# Patient Record
Sex: Male | Born: 1990 | Race: White | Hispanic: No | Marital: Single | Smoking: Former smoker
Health system: Southern US, Community
[De-identification: ages and names within clinical notes are randomized; demographics above are authoritative.]

## PROBLEM LIST (undated history)

## (undated) DIAGNOSIS — S22009A Unspecified fracture of unspecified thoracic vertebra, initial encounter for closed fracture: Secondary | ICD-10-CM

## (undated) DIAGNOSIS — Z789 Other specified health status: Secondary | ICD-10-CM

## (undated) HISTORY — PX: FRACTURE SURGERY: SHX138

---

## 2016-07-18 ENCOUNTER — Emergency Department (HOSPITAL_COMMUNITY): Payer: 59

## 2016-07-18 ENCOUNTER — Inpatient Hospital Stay (HOSPITAL_COMMUNITY)
Admission: EM | Admit: 2016-07-18 | Discharge: 2016-07-27 | DRG: 504 | Disposition: A | Discharge status: Home Health | Payer: 59 | Attending: Emergency Medicine | Admitting: Emergency Medicine

## 2016-07-18 ENCOUNTER — Observation Stay (HOSPITAL_COMMUNITY): Payer: 59

## 2016-07-18 ENCOUNTER — Encounter (HOSPITAL_COMMUNITY): Payer: Self-pay | Admitting: Emergency Medicine

## 2016-07-18 DIAGNOSIS — G8918 Other acute postprocedural pain: Secondary | ICD-10-CM

## 2016-07-18 DIAGNOSIS — S52501A Unspecified fracture of the lower end of right radius, initial encounter for closed fracture: Secondary | ICD-10-CM

## 2016-07-18 DIAGNOSIS — D62 Acute posthemorrhagic anemia: Secondary | ICD-10-CM

## 2016-07-18 DIAGNOSIS — S22000A Wedge compression fracture of unspecified thoracic vertebra, initial encounter for closed fracture: Secondary | ICD-10-CM

## 2016-07-18 DIAGNOSIS — S62101A Fracture of unspecified carpal bone, right wrist, initial encounter for closed fracture: Secondary | ICD-10-CM

## 2016-07-18 DIAGNOSIS — S92902A Unspecified fracture of left foot, initial encounter for closed fracture: Secondary | ICD-10-CM

## 2016-07-18 DIAGNOSIS — S92352A Displaced fracture of fifth metatarsal bone, left foot, initial encounter for closed fracture: Secondary | ICD-10-CM | POA: Diagnosis present

## 2016-07-18 DIAGNOSIS — Z720 Tobacco use: Secondary | ICD-10-CM

## 2016-07-18 DIAGNOSIS — S63015A Dislocation of distal radioulnar joint of left wrist, initial encounter: Secondary | ICD-10-CM

## 2016-07-18 DIAGNOSIS — M25532 Pain in left wrist: Secondary | ICD-10-CM | POA: Diagnosis not present

## 2016-07-18 DIAGNOSIS — N179 Acute kidney failure, unspecified: Secondary | ICD-10-CM | POA: Diagnosis present

## 2016-07-18 DIAGNOSIS — I10 Essential (primary) hypertension: Secondary | ICD-10-CM

## 2016-07-18 DIAGNOSIS — E871 Hypo-osmolality and hyponatremia: Secondary | ICD-10-CM | POA: Diagnosis not present

## 2016-07-18 DIAGNOSIS — Z419 Encounter for procedure for purposes other than remedying health state, unspecified: Secondary | ICD-10-CM

## 2016-07-18 DIAGNOSIS — E669 Obesity, unspecified: Secondary | ICD-10-CM | POA: Diagnosis present

## 2016-07-18 DIAGNOSIS — S22069A Unspecified fracture of T7-T8 vertebra, initial encounter for closed fracture: Secondary | ICD-10-CM | POA: Diagnosis present

## 2016-07-18 DIAGNOSIS — S52571A Other intraarticular fracture of lower end of right radius, initial encounter for closed fracture: Secondary | ICD-10-CM | POA: Diagnosis present

## 2016-07-18 DIAGNOSIS — R739 Hyperglycemia, unspecified: Secondary | ICD-10-CM

## 2016-07-18 DIAGNOSIS — S92513A Displaced fracture of proximal phalanx of unspecified lesser toe(s), initial encounter for closed fracture: Secondary | ICD-10-CM | POA: Diagnosis present

## 2016-07-18 DIAGNOSIS — S22079A Unspecified fracture of T9-T10 vertebra, initial encounter for closed fracture: Secondary | ICD-10-CM | POA: Diagnosis present

## 2016-07-18 DIAGNOSIS — M25562 Pain in left knee: Secondary | ICD-10-CM

## 2016-07-18 DIAGNOSIS — S92241A Displaced fracture of medial cuneiform of right foot, initial encounter for closed fracture: Secondary | ICD-10-CM | POA: Diagnosis not present

## 2016-07-18 DIAGNOSIS — S62102A Fracture of unspecified carpal bone, left wrist, initial encounter for closed fracture: Secondary | ICD-10-CM

## 2016-07-18 DIAGNOSIS — S52502A Unspecified fracture of the lower end of left radius, initial encounter for closed fracture: Secondary | ICD-10-CM

## 2016-07-18 DIAGNOSIS — S52572A Other intraarticular fracture of lower end of left radius, initial encounter for closed fracture: Secondary | ICD-10-CM | POA: Diagnosis present

## 2016-07-18 DIAGNOSIS — S92312A Displaced fracture of first metatarsal bone, left foot, initial encounter for closed fracture: Secondary | ICD-10-CM | POA: Diagnosis present

## 2016-07-18 DIAGNOSIS — S22059A Unspecified fracture of T5-T6 vertebra, initial encounter for closed fracture: Secondary | ICD-10-CM | POA: Diagnosis present

## 2016-07-18 DIAGNOSIS — Z6832 Body mass index (BMI) 32.0-32.9, adult: Secondary | ICD-10-CM

## 2016-07-18 DIAGNOSIS — T148XXA Other injury of unspecified body region, initial encounter: Secondary | ICD-10-CM

## 2016-07-18 DIAGNOSIS — S92322A Displaced fracture of second metatarsal bone, left foot, initial encounter for closed fracture: Secondary | ICD-10-CM | POA: Diagnosis present

## 2016-07-18 DIAGNOSIS — F17210 Nicotine dependence, cigarettes, uncomplicated: Secondary | ICD-10-CM | POA: Diagnosis present

## 2016-07-18 DIAGNOSIS — F1010 Alcohol abuse, uncomplicated: Secondary | ICD-10-CM

## 2016-07-18 DIAGNOSIS — Q7193 Unspecified reduction defect of upper limb, bilateral: Secondary | ICD-10-CM

## 2016-07-18 DIAGNOSIS — S92332A Displaced fracture of third metatarsal bone, left foot, initial encounter for closed fracture: Secondary | ICD-10-CM | POA: Diagnosis present

## 2016-07-18 DIAGNOSIS — M25561 Pain in right knee: Secondary | ICD-10-CM

## 2016-07-18 DIAGNOSIS — S92342A Displaced fracture of fourth metatarsal bone, left foot, initial encounter for closed fracture: Secondary | ICD-10-CM | POA: Diagnosis present

## 2016-07-18 DIAGNOSIS — S92901A Unspecified fracture of right foot, initial encounter for closed fracture: Secondary | ICD-10-CM

## 2016-07-18 LAB — URINALYSIS, ROUTINE W REFLEX MICROSCOPIC
BACTERIA UA: NONE SEEN
BILIRUBIN URINE: NEGATIVE
GLUCOSE, UA: NEGATIVE mg/dL
Ketones, ur: 5 mg/dL — AB
Leukocytes, UA: NEGATIVE
Nitrite: NEGATIVE
PH: 5 (ref 5.0–8.0)
Protein, ur: 30 mg/dL — AB
SQUAMOUS EPITHELIAL / LPF: NONE SEEN

## 2016-07-18 LAB — COMPREHENSIVE METABOLIC PANEL
ALBUMIN: 4.2 g/dL (ref 3.5–5.0)
ALK PHOS: 70 U/L (ref 38–126)
ALT: 61 U/L (ref 17–63)
AST: 75 U/L — AB (ref 15–41)
Anion gap: 10 (ref 5–15)
BUN: 21 mg/dL — ABNORMAL HIGH (ref 6–20)
CALCIUM: 9.1 mg/dL (ref 8.9–10.3)
CO2: 22 mmol/L (ref 22–32)
CREATININE: 1.76 mg/dL — AB (ref 0.61–1.24)
Chloride: 104 mmol/L (ref 101–111)
GFR calc Af Amer: >60 mL/min (ref >60)
GFR calc non Af Amer: 53 mL/min — ABNORMAL LOW (ref >60)
GLUCOSE: 168 mg/dL — AB (ref 65–99)
Potassium: 4.6 mmol/L (ref 3.5–5.1)
SODIUM: 136 mmol/L (ref 135–145)
Total Bilirubin: 1.6 mg/dL — ABNORMAL HIGH (ref 0.3–1.2)
Total Protein: 7.4 g/dL (ref 6.5–8.1)

## 2016-07-18 LAB — PROTIME-INR
INR: 1.08
Prothrombin Time: 14 seconds (ref 11.4–15.2)

## 2016-07-18 LAB — CBC
HCT: 48.2 % (ref 39.0–52.0)
Hemoglobin: 16.4 g/dL (ref 13.0–17.0)
MCH: 29.7 pg (ref 26.0–34.0)
MCHC: 34 g/dL (ref 30.0–36.0)
MCV: 87.2 fL (ref 78.0–100.0)
PLATELETS: 294 10*3/uL (ref 150–400)
RBC: 5.53 MIL/uL (ref 4.22–5.81)
RDW: 12.6 % (ref 11.5–15.5)
WBC: 27 10*3/uL — ABNORMAL HIGH (ref 4.0–10.5)

## 2016-07-18 LAB — I-STAT CHEM 8, ED
BUN: 29 mg/dL — ABNORMAL HIGH (ref 6–20)
CHLORIDE: 103 mmol/L (ref 101–111)
Calcium, Ion: 1.1 mmol/L — ABNORMAL LOW (ref 1.15–1.40)
Creatinine, Ser: 1.7 mg/dL — ABNORMAL HIGH (ref 0.61–1.24)
Glucose, Bld: 163 mg/dL — ABNORMAL HIGH (ref 65–99)
HEMATOCRIT: 51 % (ref 39.0–52.0)
Hemoglobin: 17.3 g/dL — ABNORMAL HIGH (ref 13.0–17.0)
POTASSIUM: 4.5 mmol/L (ref 3.5–5.1)
SODIUM: 138 mmol/L (ref 135–145)
TCO2: 25 mmol/L (ref 0–100)

## 2016-07-18 LAB — I-STAT CG4 LACTIC ACID, ED: LACTIC ACID, VENOUS: 2.85 mmol/L — AB (ref 0.5–1.9)

## 2016-07-18 LAB — SAMPLE TO BLOOD BANK

## 2016-07-18 LAB — LACTIC ACID, PLASMA: Lactic Acid, Venous: 1.6 mmol/L (ref 0.5–1.9)

## 2016-07-18 LAB — CDS SEROLOGY

## 2016-07-18 LAB — ETHANOL

## 2016-07-18 MED ORDER — FENTANYL CITRATE (PF) 100 MCG/2ML IJ SOLN
100.0000 ug | Freq: Once | INTRAMUSCULAR | Status: DC
  Filled 2016-07-18: qty 2

## 2016-07-18 MED ORDER — SODIUM CHLORIDE 0.45 % IV SOLN
INTRAVENOUS | Status: DC
  Administered 2016-07-18 – 2016-07-20 (×6): via INTRAVENOUS

## 2016-07-18 MED ORDER — HYDROMORPHONE HCL 1 MG/ML IJ SOLN
INTRAMUSCULAR | Status: AC
  Filled 2016-07-18: qty 1

## 2016-07-18 MED ORDER — ONDANSETRON HCL 4 MG/2ML IJ SOLN: 4.0000 mg | Freq: Four times a day (QID) | INTRAMUSCULAR | Status: DC | PRN

## 2016-07-18 MED ORDER — PROPOFOL 10 MG/ML IV BOLUS
INTRAVENOUS | Status: AC | PRN
  Administered 2016-07-18: 20 mg via INTRAVENOUS
  Administered 2016-07-18: 40 mg via INTRAVENOUS
  Administered 2016-07-18: 80 mg via INTRAVENOUS
  Administered 2016-07-18: 20 mg via INTRAVENOUS

## 2016-07-18 MED ORDER — SODIUM CHLORIDE 0.9 % IV BOLUS (SEPSIS)
500.0000 mL | Freq: Once | INTRAVENOUS | Status: AC
  Administered 2016-07-18: 500 mL via INTRAVENOUS

## 2016-07-18 MED ORDER — DIPHENHYDRAMINE HCL 50 MG/ML IJ SOLN: 12.5000 mg | Freq: Four times a day (QID) | INTRAMUSCULAR | Status: DC | PRN

## 2016-07-18 MED ORDER — PROPOFOL 10 MG/ML IV BOLUS
INTRAVENOUS | Status: AC
  Filled 2016-07-18: qty 20

## 2016-07-18 MED ORDER — SODIUM CHLORIDE 0.9% FLUSH
9.0000 mL | INTRAVENOUS | Status: DC | PRN
Start: 2016-07-18 — End: 2016-07-24

## 2016-07-18 MED ORDER — DIPHENHYDRAMINE HCL 12.5 MG/5ML PO ELIX: 12.5000 mg | ORAL_SOLUTION | Freq: Four times a day (QID) | ORAL | Status: DC | PRN

## 2016-07-18 MED ORDER — ONDANSETRON HCL 4 MG/2ML IJ SOLN
4.0000 mg | Freq: Four times a day (QID) | INTRAMUSCULAR | Status: DC | PRN
Start: 2016-07-18 — End: 2016-07-27
  Administered 2016-07-21: 4 mg via INTRAVENOUS
  Filled 2016-07-18: qty 2

## 2016-07-18 MED ORDER — TETANUS-DIPHTH-ACELL PERTUSSIS 5-2.5-18.5 LF-MCG/0.5 IM SUSP
0.5000 mL | Freq: Once | INTRAMUSCULAR | Status: AC
  Administered 2016-07-18: 0.5 mL via INTRAMUSCULAR
  Filled 2016-07-18: qty 0.5

## 2016-07-18 MED ORDER — MIDAZOLAM HCL 2 MG/2ML IJ SOLN
4.0000 mg | Freq: Once | INTRAMUSCULAR | Status: DC
  Filled 2016-07-18: qty 4

## 2016-07-18 MED ORDER — HYDROMORPHONE 1 MG/ML IV SOLN
INTRAVENOUS | Status: DC
  Administered 2016-07-18: 25 mg via INTRAVENOUS
  Filled 2016-07-18 (×2): qty 25

## 2016-07-18 MED ORDER — NALOXONE HCL 0.4 MG/ML IJ SOLN: 0.4000 mg | INTRAMUSCULAR | Status: DC | PRN

## 2016-07-18 MED ORDER — LIDOCAINE HCL 2 % IJ SOLN
20.0000 mL | Freq: Once | INTRAMUSCULAR | Status: AC
  Administered 2016-07-18: 400 mg
  Filled 2016-07-18: qty 20

## 2016-07-18 MED ORDER — HYDROMORPHONE HCL 1 MG/ML IJ SOLN
1.0000 mg | Freq: Once | INTRAMUSCULAR | Status: AC
  Administered 2016-07-18: 1 mg via INTRAVENOUS

## 2016-07-18 MED ORDER — IOPAMIDOL (ISOVUE-300) INJECTION 61%
INTRAVENOUS | Status: AC
  Administered 2016-07-18: 100 mL
  Filled 2016-07-18: qty 100

## 2016-07-18 MED ORDER — PROPOFOL 10 MG/ML IV BOLUS: 0.5000 mg/kg | Freq: Once | INTRAVENOUS | Status: DC

## 2016-07-18 MED ORDER — HYDROMORPHONE HCL 1 MG/ML IJ SOLN
1.0000 mg | INTRAMUSCULAR | Status: DC | PRN
Start: 2016-07-18 — End: 2016-07-24
  Administered 2016-07-18 – 2016-07-20 (×5): 1 mg via INTRAVENOUS
  Filled 2016-07-18 (×5): qty 1

## 2016-07-18 MED ORDER — ONDANSETRON HCL 4 MG PO TABS: 4.0000 mg | ORAL_TABLET | Freq: Four times a day (QID) | ORAL | Status: DC | PRN

## 2016-07-18 MED ORDER — HYDROMORPHONE HCL 1 MG/ML IJ SOLN
1.0000 mg | Freq: Once | INTRAMUSCULAR | Status: AC
  Administered 2016-07-18: 1 mg via INTRAVENOUS
  Filled 2016-07-18: qty 1

## 2016-07-18 NOTE — Progress Notes (Signed)
Orthopedic Tech Progress Note Patient Details:  Daniel BasemanBrandon Medina 1990-06-26 [id number]  Ortho Devices Type of Ortho Device: Short leg splint Ortho Device/Splint Location: Applied Short leg splint to pt right leg/ankle.  pt tolerated very well.  Family at bedside.  Ortho Device/Splint Interventions: Application   Alvina ChouWilliams, Anik Wesch C 07/18/2016, 12:38 PM

## 2016-07-18 NOTE — ED Notes (Signed)
Patient transported to CT and xray 

## 2016-07-18 NOTE — Progress Notes (Signed)
Orthopedic Tech Progress Note Patient Details:  Daniel Medina [birth year] [id number]  Ortho Devices Type of Ortho Device: Finger trap, Short arm splint, Arm sling Finger Trap Weight: 20Lbs Ortho Device/Splint Location: Bilateral Wrists  (Left and right)   Provided Dr. Philip AspenMike Jefferys ER Doctor with supplies to apply Short Arm Splints to pt left and right wrist.  (Items used: plaster, webrolls, ace bandage wraps, and Arm Slings) Ortho Device/Splint Interventions: Other (comment), Application, Adjustment   Alvina ChouWilliams, Briani Maul C 07/18/2016, 12:49 PM

## 2016-07-18 NOTE — Progress Notes (Signed)
Orthopedic Tech Progress Note Patient Details:  Daniel BasemanBrandon Swindler Jul 23, 1990 [id number]   Patient ID: Daniel Medina, male   DOB: Jul 23, 1990, 26 y.o.   MRN: [id number]  Applied Short leg splint to pt right leg/ankle. pt tolerated very well. Family at bedside.   Alvina ChouWilliams, Yisrael Obryan C 07/18/2016, 12:49 PM

## 2016-07-18 NOTE — Progress Notes (Signed)
Placed on telemetry
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
## 2016-07-18 NOTE — Progress Notes (Signed)
Subjective: Pain in bilateral wrists.  Notes numbness in left digits.  Left splint feels tight.  States a PCA has been ordered, but has not arrived.   Objective: Vital signs in last 24 hours: Temp:  [98.5 F (36.9 C)-99.5 F (37.5 C)] 99.5 F (37.5 C) (05/08 1554) Pulse Rate:  [62-115] 92 (05/08 1554) Resp:  [13-27] 18 (05/08 1554) BP: (117-153)/(59-93) 144/66 (05/08 1554) SpO2:  [95 %-100 %] 98 % (05/08 1554) Weight:  [108.9 kg (240 lb)] 108.9 kg (240 lb) (05/08 0805)  Intake/Output from previous day: No intake/output data recorded. Intake/Output this shift: Total I/O In: 2479.2 [I.V.:1479.2; IV Piggyback:1000] Out: 600 [Urine:600]   Recent Labs  07/18/16 0810 07/18/16 0821  HGB 16.4 17.3*    Recent Labs  07/18/16 0810 07/18/16 0821  WBC 27.0*  --   RBC 5.53  --   HCT 48.2 51.0  PLT 294  --     Recent Labs  07/18/16 0810 07/18/16 0821  NA 136 138  K 4.6 4.5  CL 104 103  CO2 22  --   BUN 21* 29*  CREATININE 1.76* 1.70*  GLUCOSE 168* 163*  CALCIUM 9.1  --     Recent Labs  07/18/16 0810  INR 1.08    right hand: intact sensation and capillary refill all digits.  splint well fitting.  left hand: decreased sensation all digits.  +epl/fpl/io.  splint loosened and adjusted to decrease flexion at wrist.  compartments soft.  Assessment/Plan: s/p closed reduction bilateral distal radius fractures.  Will review CT scans.  Will need operative fixation, likely with bridge plates bilaterally.  Will watch sensation in left hand.  May have been due to tight splint and swelling.   Daniel Medina R 07/18/2016, 6:29 PM

## 2016-07-18 NOTE — ED Notes (Signed)
Verbal ok per Renae FicklePaul, [facility].
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
## 2016-07-18 NOTE — ED Notes (Signed)
C-collar removed by trauma surgery.
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
## 2016-07-18 NOTE — Sedation Documentation (Signed)
Right wrist appears to be reduced by Dr Merlyn LotKuzma.  Ortho team placing cast.
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
## 2016-07-18 NOTE — Sedation Documentation (Signed)
Left wrist appears to have been reduced by Dr Merlyn LotKuzma.  Cast being placed by ortho team.
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
## 2016-07-18 NOTE — ED Provider Notes (Signed)
.  Sedation Date/Time: 07/18/2016 1:00 PM Performed by: Margarita GrizzleAY, Brittan Butterbaugh Authorized by: Margarita GrizzleAY, Karenann Mcgrory   Consent:    Consent obtained:  Written and verbal   Consent given by:  Patient   Risks discussed:  Allergic reaction, dysrhythmia, prolonged hypoxia resulting in organ damage, inadequate sedation, nausea and respiratory compromise necessitating ventilatory assistance and intubation Indications:    Procedure performed:  Dislocation reduction   Procedure necessitating sedation performed by:  Different physician   Intended level of sedation:  Moderate (conscious sedation) Pre-sedation assessment:    NPO status caution: urgency dictates proceeding with non-ideal NPO status     ASA classification: class 1 - normal, healthy patient     Neck mobility: normal     Mouth opening:  3 or more finger widths   Thyromental distance:  4 finger widths   Mallampati score:  II - soft palate, uvula, fauces visible   Pre-sedation assessments completed and reviewed: airway patency, cardiovascular function, hydration status and mental status     History of difficult intubation: no     Pre-sedation assessment completed:  07/18/2016 1:00 PM Immediate pre-procedure details:    Reassessment: Patient reassessed immediately prior to procedure     Reviewed: vital signs and relevant labs/tests     Verified: bag valve mask available, emergency equipment available, intubation equipment available, IV patency confirmed, oxygen available and suction available   Procedure details (see MAR for exact dosages):    Sedation start time:  07/18/2016 1:55 PM   Preoxygenation:  Nasal cannula   Sedation:  Propofol   Intra-procedure monitoring:  Continuous capnometry, continuous pulse oximetry, blood pressure monitoring, cardiac monitor, frequent LOC assessments and frequent vital sign checks   Intra-procedure events: none     Sedation end time:  07/18/2016 1:55 PM   Total sedation time (minutes):  35 Post-procedure details:   Post-sedation assessment completed:  07/18/2016 1:55 PM   Attendance: Constant attendance by certified staff until patient recovered     Recovery: Patient returned to pre-procedure baseline     Estimated blood loss (see I/O flowsheets): no     Post-sedation assessments completed and reviewed: airway patency, cardiovascular function, mental status, nausea/vomiting, pain level and respiratory function     Specimens recovered:  None   Patient is stable for discharge or admission: no     Patient tolerance:  Tolerated well, no immediate complications      Margarita Grizzleay, Miriam Liles, MD 07/18/16 1356
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
## 2016-07-19 DIAGNOSIS — S92902A Unspecified fracture of left foot, initial encounter for closed fracture: Secondary | ICD-10-CM

## 2016-07-19 DIAGNOSIS — S52501A Unspecified fracture of the lower end of right radius, initial encounter for closed fracture: Secondary | ICD-10-CM

## 2016-07-19 DIAGNOSIS — S92901A Unspecified fracture of right foot, initial encounter for closed fracture: Secondary | ICD-10-CM

## 2016-07-19 DIAGNOSIS — S52502A Unspecified fracture of the lower end of left radius, initial encounter for closed fracture: Secondary | ICD-10-CM

## 2016-07-19 DIAGNOSIS — S63015A Dislocation of distal radioulnar joint of left wrist, initial encounter: Secondary | ICD-10-CM

## 2016-07-19 LAB — BASIC METABOLIC PANEL
ANION GAP: 8 (ref 5–15)
BUN: 14 mg/dL (ref 6–20)
CALCIUM: 8.5 mg/dL — AB (ref 8.9–10.3)
CHLORIDE: 102 mmol/L (ref 101–111)
CO2: 23 mmol/L (ref 22–32)
Creatinine, Ser: 1.39 mg/dL — ABNORMAL HIGH (ref 0.61–1.24)
GFR calc non Af Amer: >60 mL/min (ref >60)
Glucose, Bld: 119 mg/dL — ABNORMAL HIGH (ref 65–99)
POTASSIUM: 3.9 mmol/L (ref 3.5–5.1)
Sodium: 133 mmol/L — ABNORMAL LOW (ref 135–145)

## 2016-07-19 LAB — CBC
HCT: 42.2 % (ref 39.0–52.0)
HEMOGLOBIN: 13.9 g/dL (ref 13.0–17.0)
MCH: 28.9 pg (ref 26.0–34.0)
MCHC: 32.9 g/dL (ref 30.0–36.0)
MCV: 87.7 fL (ref 78.0–100.0)
Platelets: 211 10*3/uL (ref 150–400)
RBC: 4.81 MIL/uL (ref 4.22–5.81)
RDW: 12.8 % (ref 11.5–15.5)
WBC: 13.2 10*3/uL — ABNORMAL HIGH (ref 4.0–10.5)

## 2016-07-19 LAB — HIV ANTIBODY (ROUTINE TESTING W REFLEX): HIV Screen 4th Generation wRfx: NONREACTIVE

## 2016-07-19 MED ORDER — ACETAMINOPHEN 325 MG PO TABS
650.0000 mg | ORAL_TABLET | Freq: Four times a day (QID) | ORAL | Status: DC
  Administered 2016-07-19 – 2016-07-20 (×4): 650 mg via ORAL
  Filled 2016-07-19 (×5): qty 2

## 2016-07-19 MED ORDER — HYDROMORPHONE 1 MG/ML IV SOLN
INTRAVENOUS | Status: DC
  Administered 2016-07-19: 7.75 mg via INTRAVENOUS
  Administered 2016-07-19: 2.5 mg via INTRAVENOUS
  Administered 2016-07-19 (×2): 0 via INTRAVENOUS
  Administered 2016-07-20: 4.5 mg via INTRAVENOUS
  Administered 2016-07-20: 5 mg via INTRAVENOUS
  Administered 2016-07-20: 8 mg via INTRAVENOUS
  Administered 2016-07-20: 1.5 mg via INTRAVENOUS
  Administered 2016-07-20: 4 mg via INTRAVENOUS
  Administered 2016-07-20: 2.5 mg via INTRAVENOUS
  Administered 2016-07-21: 25 mg via INTRAVENOUS
  Administered 2016-07-22: 11:00:00 via INTRAVENOUS
  Administered 2016-07-22: 5 mg via INTRAVENOUS
  Administered 2016-07-22: 8 mg via INTRAVENOUS
  Administered 2016-07-22: 7.5 mg via INTRAVENOUS
  Administered 2016-07-23: 1 mg via INTRAVENOUS
  Administered 2016-07-23 (×2): via INTRAVENOUS
  Administered 2016-07-23: 2.5 mg via INTRAVENOUS
  Administered 2016-07-24: 5.5 mg via INTRAVENOUS
  Administered 2016-07-24: 4.5 mg via INTRAVENOUS
  Administered 2016-07-24: 2 mg via INTRAVENOUS
  Filled 2016-07-19 (×9): qty 25

## 2016-07-19 MED ORDER — OXYCODONE HCL 5 MG PO TABS
5.0000 mg | ORAL_TABLET | ORAL | Status: DC | PRN
  Administered 2016-07-19: 5 mg via ORAL
  Filled 2016-07-19: qty 1

## 2016-07-19 MED ORDER — DOCUSATE SODIUM 100 MG PO CAPS
100.0000 mg | ORAL_CAPSULE | Freq: Two times a day (BID) | ORAL | Status: DC
  Administered 2016-07-22 – 2016-07-27 (×12): 100 mg via ORAL
  Filled 2016-07-19 (×14): qty 1
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
## 2016-07-20 ENCOUNTER — Encounter (HOSPITAL_COMMUNITY): Payer: Self-pay | Admitting: Anesthesiology

## 2016-07-20 DIAGNOSIS — I10 Essential (primary) hypertension: Secondary | ICD-10-CM | POA: Diagnosis not present

## 2016-07-20 DIAGNOSIS — G5602 Carpal tunnel syndrome, left upper limb: Secondary | ICD-10-CM | POA: Diagnosis not present

## 2016-07-20 DIAGNOSIS — S22069A Unspecified fracture of T7-T8 vertebra, initial encounter for closed fracture: Secondary | ICD-10-CM | POA: Diagnosis present

## 2016-07-20 DIAGNOSIS — R739 Hyperglycemia, unspecified: Secondary | ICD-10-CM | POA: Diagnosis not present

## 2016-07-20 DIAGNOSIS — S92342A Displaced fracture of fourth metatarsal bone, left foot, initial encounter for closed fracture: Secondary | ICD-10-CM | POA: Diagnosis present

## 2016-07-20 DIAGNOSIS — G8918 Other acute postprocedural pain: Secondary | ICD-10-CM | POA: Diagnosis not present

## 2016-07-20 DIAGNOSIS — S92901D Unspecified fracture of right foot, subsequent encounter for fracture with routine healing: Secondary | ICD-10-CM | POA: Diagnosis not present

## 2016-07-20 DIAGNOSIS — M25561 Pain in right knee: Secondary | ICD-10-CM | POA: Diagnosis not present

## 2016-07-20 DIAGNOSIS — T148XXA Other injury of unspecified body region, initial encounter: Secondary | ICD-10-CM | POA: Diagnosis not present

## 2016-07-20 DIAGNOSIS — S92322A Displaced fracture of second metatarsal bone, left foot, initial encounter for closed fracture: Secondary | ICD-10-CM | POA: Diagnosis present

## 2016-07-20 DIAGNOSIS — S52502K Unspecified fracture of the lower end of left radius, subsequent encounter for closed fracture with nonunion: Secondary | ICD-10-CM | POA: Diagnosis not present

## 2016-07-20 DIAGNOSIS — E669 Obesity, unspecified: Secondary | ICD-10-CM | POA: Diagnosis present

## 2016-07-20 DIAGNOSIS — S52501K Unspecified fracture of the lower end of right radius, subsequent encounter for closed fracture with nonunion: Secondary | ICD-10-CM | POA: Diagnosis not present

## 2016-07-20 DIAGNOSIS — S22059A Unspecified fracture of T5-T6 vertebra, initial encounter for closed fracture: Secondary | ICD-10-CM | POA: Diagnosis present

## 2016-07-20 DIAGNOSIS — M25532 Pain in left wrist: Secondary | ICD-10-CM | POA: Diagnosis present

## 2016-07-20 DIAGNOSIS — S92312A Displaced fracture of first metatarsal bone, left foot, initial encounter for closed fracture: Secondary | ICD-10-CM | POA: Diagnosis present

## 2016-07-20 DIAGNOSIS — S52571A Other intraarticular fracture of lower end of right radius, initial encounter for closed fracture: Secondary | ICD-10-CM | POA: Diagnosis present

## 2016-07-20 DIAGNOSIS — N179 Acute kidney failure, unspecified: Secondary | ICD-10-CM | POA: Diagnosis present

## 2016-07-20 DIAGNOSIS — D62 Acute posthemorrhagic anemia: Secondary | ICD-10-CM | POA: Diagnosis present

## 2016-07-20 DIAGNOSIS — F17210 Nicotine dependence, cigarettes, uncomplicated: Secondary | ICD-10-CM | POA: Diagnosis not present

## 2016-07-20 DIAGNOSIS — E871 Hypo-osmolality and hyponatremia: Secondary | ICD-10-CM | POA: Diagnosis not present

## 2016-07-20 DIAGNOSIS — F1010 Alcohol abuse, uncomplicated: Secondary | ICD-10-CM | POA: Diagnosis not present

## 2016-07-20 DIAGNOSIS — S92241A Displaced fracture of medial cuneiform of right foot, initial encounter for closed fracture: Secondary | ICD-10-CM | POA: Diagnosis present

## 2016-07-20 DIAGNOSIS — S92332A Displaced fracture of third metatarsal bone, left foot, initial encounter for closed fracture: Secondary | ICD-10-CM | POA: Diagnosis present

## 2016-07-20 DIAGNOSIS — Z6832 Body mass index (BMI) 32.0-32.9, adult: Secondary | ICD-10-CM | POA: Diagnosis not present

## 2016-07-20 DIAGNOSIS — S22079A Unspecified fracture of T9-T10 vertebra, initial encounter for closed fracture: Secondary | ICD-10-CM | POA: Diagnosis present

## 2016-07-20 DIAGNOSIS — Z720 Tobacco use: Secondary | ICD-10-CM | POA: Diagnosis not present

## 2016-07-20 DIAGNOSIS — S92513A Displaced fracture of proximal phalanx of unspecified lesser toe(s), initial encounter for closed fracture: Secondary | ICD-10-CM | POA: Diagnosis present

## 2016-07-20 DIAGNOSIS — S92352A Displaced fracture of fifth metatarsal bone, left foot, initial encounter for closed fracture: Secondary | ICD-10-CM | POA: Diagnosis present

## 2016-07-20 DIAGNOSIS — S52572A Other intraarticular fracture of lower end of left radius, initial encounter for closed fracture: Secondary | ICD-10-CM | POA: Diagnosis present

## 2016-07-20 LAB — BASIC METABOLIC PANEL
ANION GAP: 9 (ref 5–15)
BUN: 9 mg/dL (ref 6–20)
CHLORIDE: 99 mmol/L — AB (ref 101–111)
CO2: 24 mmol/L (ref 22–32)
Calcium: 8.2 mg/dL — ABNORMAL LOW (ref 8.9–10.3)
Creatinine, Ser: 1.21 mg/dL (ref 0.61–1.24)
GFR calc non Af Amer: >60 mL/min (ref >60)
Glucose, Bld: 107 mg/dL — ABNORMAL HIGH (ref 65–99)
POTASSIUM: 4 mmol/L (ref 3.5–5.1)
Sodium: 132 mmol/L — ABNORMAL LOW (ref 135–145)

## 2016-07-20 LAB — CK: Total CK: 3163 U/L — ABNORMAL HIGH (ref 49–397)

## 2016-07-20 MED ORDER — CEFAZOLIN SODIUM-DEXTROSE 2-4 GM/100ML-% IV SOLN
2.0000 g | INTRAVENOUS | Status: AC
  Administered 2016-07-21: 2 g via INTRAVENOUS
  Filled 2016-07-20 (×2): qty 100

## 2016-07-20 MED ORDER — OXYCODONE HCL 5 MG PO TABS
5.0000 mg | ORAL_TABLET | ORAL | Status: DC | PRN
  Administered 2016-07-20: 10 mg via ORAL
  Filled 2016-07-20 (×3): qty 2

## 2016-07-20 MED ORDER — HEPARIN SODIUM (PORCINE) 5000 UNIT/ML IJ SOLN
5000.0000 [IU] | Freq: Three times a day (TID) | INTRAMUSCULAR | Status: AC
  Administered 2016-07-20 (×2): 5000 [IU] via SUBCUTANEOUS
  Filled 2016-07-20 (×2): qty 1

## 2016-07-20 MED ORDER — SODIUM CHLORIDE 0.9 % IV SOLN
INTRAVENOUS | Status: DC
  Administered 2016-07-20: 11:00:00 via INTRAVENOUS

## 2016-07-20 MED ORDER — OXYCODONE-ACETAMINOPHEN 5-325 MG PO TABS
1.0000 | ORAL_TABLET | Freq: Four times a day (QID) | ORAL | Status: DC | PRN
  Administered 2016-07-20: 2 via ORAL
  Filled 2016-07-20: qty 2

## 2016-07-20 MED ORDER — SODIUM CHLORIDE 0.9 % IV SOLN: INTRAVENOUS | Status: DC

## 2016-07-20 MED ORDER — ACETAMINOPHEN 325 MG PO TABS
650.0000 mg | ORAL_TABLET | Freq: Four times a day (QID) | ORAL | Status: DC | PRN
Start: 2016-07-20 — End: 2016-07-21

## 2016-07-20 MED ORDER — METHOCARBAMOL 500 MG PO TABS
1000.0000 mg | ORAL_TABLET | Freq: Four times a day (QID) | ORAL | Status: DC
  Administered 2016-07-20 – 2016-07-27 (×26): 1000 mg via ORAL
  Filled 2016-07-20 (×27): qty 2
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
## 2016-07-20 NOTE — Progress Notes (Signed)
Consent signed for am surgery, placed in chart.

## 2016-07-20 NOTE — Progress Notes (Signed)
Late entry.  Seen 07/19/16 at 1300.  Subjective: Pain in bilateral wrists.  Left greater than right.  Sensation in left hand improved from yesterday.        Objective: Vital signs in last 24 hours: Temp:  [98.3 F (36.8 C)-99.4 F (37.4 C)] 98.3 F (36.8 C) (05/10 0420) Pulse Rate:  [102-118] 102 (05/10 0420) Resp:  [18-22] 20 (05/10 1300) BP: (125-152)/(67-83) 125/67 (05/10 0420) SpO2:  [92 %-100 %] 98 % (05/10 1300)  Intake/Output from previous day: 05/09 0701 - 05/10 0700 In: 2469.7 [P.O.:360; I.V.:2109.7] Out: 3635 [Urine:3635] Intake/Output this shift: Total I/O In: 525 [P.O.:525] Out: 1150 [Urine:1150]   Recent Labs (last 2 labs)    Recent Labs  07/18/16 0810 07/18/16 0821 07/19/16 0521  HGB 16.4 17.3* 13.9      Recent Labs (last 2 labs)    Recent Labs  07/18/16 0810 07/18/16 0821 07/19/16 0521  WBC 27.0*  --  13.2*  RBC 5.53  --  4.81  HCT 48.2 51.0 42.2  PLT 294  --  211      Recent Labs (last 2 labs)    Recent Labs  07/19/16 0521 07/20/16 0610  NA 133* 132*  K 3.9 4.0  CL 102 99*  CO2 23 24  BUN 14 9  CREATININE 1.39* 1.21  GLUCOSE 119* 107*  CALCIUM 8.5* 8.2*      Recent Labs (last 2 labs)    Recent Labs  07/18/16 0810  INR 1.08      improved sensation in thumb index long of left hand.  brisk capillary refill all digits.  discomfort with extension of digits.  able to flex fingers.  splint unwrapped, compartments soft.  Assessment/Plan: Doing well.  No compartment syndrome.  Will need operative fixation of bilateral distal radius fractures.  Can be done as outpatient after discharge to home or rehab.   Rodger Giangregorio R 07/20/2016, 3:20 PM

## 2016-07-20 NOTE — Anesthesia Preprocedure Evaluation (Addendum)
Anesthesia Evaluation  Patient identified by MRN, date of birth, ID band Patient awake    Reviewed: Allergy & Precautions, NPO status , Patient's Chart, lab work & pertinent test results  Airway Mallampati: II       Dental no notable dental hx.    Pulmonary Current Smoker,    Pulmonary exam normal        Cardiovascular negative cardio ROS Normal cardiovascular exam Rhythm:Regular Rate:Normal     Neuro/Psych negative neurological ROS  negative psych ROS   GI/Hepatic negative GI ROS, Neg liver ROS,   Endo/Other  negative endocrine ROS  Renal/GU negative Renal ROS  negative genitourinary   Musculoskeletal negative musculoskeletal ROS (+)   Abdominal (+) + obese,   Peds  Hematology negative hematology ROS (+)   Anesthesia Other Findings   Reproductive/Obstetrics                            Anesthesia Physical Anesthesia Plan  ASA: II  Anesthesia Plan: General   Post-op Pain Management:    Induction: Intravenous  Airway Management Planned: Oral ETT  Additional Equipment:   Intra-op Plan:   Post-operative Plan: Extubation in OR  Informed Consent: I have reviewed the patients History and Physical, chart, labs and discussed the procedure including the risks, benefits and alternatives for the proposed anesthesia with the patient or authorized representative who has indicated his/her understanding and acceptance.   Dental advisory given  Plan Discussed with: CRNA and Surgeon  Anesthesia Plan Comments:        Anesthesia Quick Evaluation
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
## 2016-07-21 ENCOUNTER — Inpatient Hospital Stay (HOSPITAL_COMMUNITY): Payer: 59

## 2016-07-21 ENCOUNTER — Encounter (HOSPITAL_COMMUNITY): Admission: EM | Disposition: A | Discharge status: Home Health | Payer: Self-pay | Source: Home / Self Care

## 2016-07-21 ENCOUNTER — Inpatient Hospital Stay (HOSPITAL_COMMUNITY): Payer: 59 | Admitting: Anesthesiology

## 2016-07-21 HISTORY — PX: ORIF ANKLE FRACTURE: SHX5408

## 2016-07-21 LAB — BASIC METABOLIC PANEL
Anion gap: 8 (ref 5–15)
BUN: 6 mg/dL (ref 6–20)
CALCIUM: 8.3 mg/dL — AB (ref 8.9–10.3)
CO2: 27 mmol/L (ref 22–32)
CREATININE: 1.11 mg/dL (ref 0.61–1.24)
Chloride: 99 mmol/L — ABNORMAL LOW (ref 101–111)
GFR calc non Af Amer: >60 mL/min (ref >60)
Glucose, Bld: 114 mg/dL — ABNORMAL HIGH (ref 65–99)
Potassium: 3.7 mmol/L (ref 3.5–5.1)
SODIUM: 134 mmol/L — AB (ref 135–145)

## 2016-07-21 LAB — CBC
HCT: 36.3 % — ABNORMAL LOW (ref 39.0–52.0)
Hemoglobin: 12 g/dL — ABNORMAL LOW (ref 13.0–17.0)
MCH: 28.7 pg (ref 26.0–34.0)
MCHC: 33.1 g/dL (ref 30.0–36.0)
MCV: 86.8 fL (ref 78.0–100.0)
PLATELETS: 153 10*3/uL (ref 150–400)
RBC: 4.18 MIL/uL — AB (ref 4.22–5.81)
RDW: 12.4 % (ref 11.5–15.5)
WBC: 8.2 10*3/uL (ref 4.0–10.5)

## 2016-07-21 LAB — CK: Total CK: 3652 U/L — ABNORMAL HIGH (ref 49–397)

## 2016-07-21 SURGERY — OPEN REDUCTION INTERNAL FIXATION (ORIF) ANKLE FRACTURE
Anesthesia: General | Laterality: Bilateral

## 2016-07-21 MED ORDER — LIDOCAINE HCL 4 % EX SOLN
CUTANEOUS | Status: DC | PRN
  Administered 2016-07-21: 2 mL via TOPICAL

## 2016-07-21 MED ORDER — MIDAZOLAM HCL 5 MG/5ML IJ SOLN
INTRAMUSCULAR | Status: DC | PRN
  Administered 2016-07-21: 2 mg via INTRAVENOUS

## 2016-07-21 MED ORDER — ACETAMINOPHEN 325 MG PO TABS
650.0000 mg | ORAL_TABLET | Freq: Four times a day (QID) | ORAL | Status: DC
  Administered 2016-07-21 – 2016-07-27 (×22): 650 mg via ORAL
  Filled 2016-07-21 (×23): qty 2

## 2016-07-21 MED ORDER — HYDROMORPHONE HCL 1 MG/ML IJ SOLN
INTRAMUSCULAR | Status: AC
Start: 2016-07-21 — End: 2016-07-21
  Filled 2016-07-21: qty 0.5

## 2016-07-21 MED ORDER — ROCURONIUM BROMIDE 10 MG/ML (PF) SYRINGE
PREFILLED_SYRINGE | INTRAVENOUS | Status: AC
  Filled 2016-07-21: qty 5

## 2016-07-21 MED ORDER — ONDANSETRON HCL 4 MG/2ML IJ SOLN
INTRAMUSCULAR | Status: AC
  Filled 2016-07-21: qty 2

## 2016-07-21 MED ORDER — LIDOCAINE 2% (20 MG/ML) 5 ML SYRINGE
INTRAMUSCULAR | Status: AC
  Filled 2016-07-21: qty 10

## 2016-07-21 MED ORDER — LIDOCAINE HCL (CARDIAC) 20 MG/ML IV SOLN
INTRAVENOUS | Status: DC | PRN
  Administered 2016-07-21: 40 mg via INTRAVENOUS

## 2016-07-21 MED ORDER — ARTIFICIAL TEARS OPHTHALMIC OINT: TOPICAL_OINTMENT | OPHTHALMIC | Status: DC | PRN

## 2016-07-21 MED ORDER — ARTIFICIAL TEARS OPHTHALMIC OINT
TOPICAL_OINTMENT | OPHTHALMIC | Status: AC
  Filled 2016-07-21: qty 3.5

## 2016-07-21 MED ORDER — PROMETHAZINE HCL 25 MG/ML IJ SOLN: 6.2500 mg | INTRAMUSCULAR | Status: DC | PRN

## 2016-07-21 MED ORDER — HYDROMORPHONE HCL 1 MG/ML IJ SOLN
0.2500 mg | INTRAMUSCULAR | Status: DC | PRN
  Administered 2016-07-21: 0.5 mg via INTRAVENOUS

## 2016-07-21 MED ORDER — SUCCINYLCHOLINE CHLORIDE 200 MG/10ML IV SOSY
PREFILLED_SYRINGE | INTRAVENOUS | Status: AC
  Filled 2016-07-21: qty 10

## 2016-07-21 MED ORDER — SODIUM CHLORIDE 0.9 % IJ SOLN
INTRAMUSCULAR | Status: AC
  Filled 2016-07-21: qty 10

## 2016-07-21 MED ORDER — SUFENTANIL CITRATE 50 MCG/ML IV SOLN
INTRAVENOUS | Status: DC | PRN
  Administered 2016-07-21 (×3): 5 ug via INTRAVENOUS
  Administered 2016-07-21: 10 ug via INTRAVENOUS
  Administered 2016-07-21 (×2): 5 ug via INTRAVENOUS

## 2016-07-21 MED ORDER — MEPERIDINE HCL 25 MG/ML IJ SOLN: 6.2500 mg | INTRAMUSCULAR | Status: DC | PRN

## 2016-07-21 MED ORDER — SUGAMMADEX SODIUM 200 MG/2ML IV SOLN
INTRAVENOUS | Status: DC | PRN
  Administered 2016-07-21: 150 mg via INTRAVENOUS

## 2016-07-21 MED ORDER — CEFAZOLIN SODIUM-DEXTROSE 2-4 GM/100ML-% IV SOLN
2.0000 g | Freq: Three times a day (TID) | INTRAVENOUS | Status: AC
  Administered 2016-07-21 – 2016-07-22 (×3): 2 g via INTRAVENOUS
  Filled 2016-07-21 (×3): qty 100

## 2016-07-21 MED ORDER — SUCCINYLCHOLINE CHLORIDE 20 MG/ML IJ SOLN
INTRAMUSCULAR | Status: DC | PRN
  Administered 2016-07-21: 120 mg via INTRAVENOUS

## 2016-07-21 MED ORDER — 0.9 % SODIUM CHLORIDE (POUR BTL) OPTIME
TOPICAL | Status: DC | PRN
  Administered 2016-07-21: 1000 mL

## 2016-07-21 MED ORDER — LACTATED RINGERS IV SOLN
INTRAVENOUS | Status: DC | PRN
  Administered 2016-07-21 (×2): via INTRAVENOUS

## 2016-07-21 MED ORDER — ROCURONIUM BROMIDE 100 MG/10ML IV SOLN
INTRAVENOUS | Status: DC | PRN
  Administered 2016-07-21: 20 mg via INTRAVENOUS
  Administered 2016-07-21: 40 mg via INTRAVENOUS

## 2016-07-21 MED ORDER — SUGAMMADEX SODIUM 200 MG/2ML IV SOLN
INTRAVENOUS | Status: AC
  Filled 2016-07-21: qty 2

## 2016-07-21 MED ORDER — ACETAMINOPHEN 10 MG/ML IV SOLN
INTRAVENOUS | Status: AC
  Filled 2016-07-21: qty 100

## 2016-07-21 MED ORDER — KETOROLAC TROMETHAMINE 30 MG/ML IJ SOLN
INTRAMUSCULAR | Status: AC
  Filled 2016-07-21: qty 1

## 2016-07-21 MED ORDER — SUFENTANIL CITRATE 50 MCG/ML IV SOLN
INTRAVENOUS | Status: AC
  Filled 2016-07-21: qty 1

## 2016-07-21 MED ORDER — MIDAZOLAM HCL 2 MG/2ML IJ SOLN
INTRAMUSCULAR | Status: AC
  Filled 2016-07-21: qty 2

## 2016-07-21 MED ORDER — ACETAMINOPHEN 10 MG/ML IV SOLN
INTRAVENOUS | Status: DC | PRN
  Administered 2016-07-21: 1000 mg via INTRAVENOUS

## 2016-07-21 MED ORDER — PROPOFOL 10 MG/ML IV BOLUS
INTRAVENOUS | Status: AC
  Filled 2016-07-21: qty 20

## 2016-07-21 MED ORDER — KETAMINE HCL 10 MG/ML IJ SOLN
INTRAMUSCULAR | Status: DC | PRN
  Administered 2016-07-21 (×2): 25 mg via INTRAVENOUS

## 2016-07-21 MED ORDER — KETAMINE HCL-SODIUM CHLORIDE 100-0.9 MG/10ML-% IV SOSY
PREFILLED_SYRINGE | INTRAVENOUS | Status: AC
  Filled 2016-07-21: qty 10

## 2016-07-21 MED ORDER — PHENYLEPHRINE 40 MCG/ML (10ML) SYRINGE FOR IV PUSH (FOR BLOOD PRESSURE SUPPORT)
PREFILLED_SYRINGE | INTRAVENOUS | Status: AC
Start: 2016-07-21 — End: 2016-07-21
  Filled 2016-07-21: qty 10

## 2016-07-21 MED ORDER — EPHEDRINE 5 MG/ML INJ
INTRAVENOUS | Status: AC
  Filled 2016-07-21: qty 10

## 2016-07-21 MED ORDER — SODIUM CHLORIDE 0.9 % IV SOLN
INTRAVENOUS | Status: DC
  Administered 2016-07-21 – 2016-07-27 (×5): via INTRAVENOUS

## 2016-07-21 MED ORDER — PROPOFOL 10 MG/ML IV BOLUS
INTRAVENOUS | Status: DC | PRN
  Administered 2016-07-21: 150 mg via INTRAVENOUS

## 2016-07-21 MED ORDER — KETOROLAC TROMETHAMINE 30 MG/ML IJ SOLN
30.0000 mg | Freq: Once | INTRAMUSCULAR | Status: DC | PRN
  Administered 2016-07-21: 30 mg via INTRAVENOUS

## 2016-07-21 SURGICAL SUPPLY — 66 items
BANDAGE ACE 4X5 VEL STRL LF (GAUZE/BANDAGES/DRESSINGS) ×6 IMPLANT
BANDAGE ACE 6X5 VEL STRL LF (GAUZE/BANDAGES/DRESSINGS) ×6 IMPLANT
BANDAGE ESMARK 6X9 LF (GAUZE/BANDAGES/DRESSINGS) IMPLANT
BNDG ESMARK 6X9 LF (GAUZE/BANDAGES/DRESSINGS)
BNDG GAUZE ELAST 4 BULKY (GAUZE/BANDAGES/DRESSINGS) ×6 IMPLANT
BRUSH SCRUB SURG 4.25 DISP (MISCELLANEOUS) ×6 IMPLANT
COVER MAYO STAND STRL (DRAPES) ×6 IMPLANT
COVER SURGICAL LIGHT HANDLE (MISCELLANEOUS) ×3 IMPLANT
DRAPE C-ARM 42X72 X-RAY (DRAPES) ×3 IMPLANT
DRAPE C-ARMOR (DRAPES) ×3 IMPLANT
DRAPE HALF SHEET 40X57 (DRAPES) ×3 IMPLANT
DRAPE U-SHAPE 47X51 STRL (DRAPES) ×3 IMPLANT
DRSG EMULSION OIL 3X3 NADH (GAUZE/BANDAGES/DRESSINGS) IMPLANT
DRSG MEPITEL 4X7.2 (GAUZE/BANDAGES/DRESSINGS) ×6 IMPLANT
DRSG PAD ABDOMINAL 8X10 ST (GAUZE/BANDAGES/DRESSINGS) ×6 IMPLANT
ELECT REM PT RETURN 9FT ADLT (ELECTROSURGICAL) ×3
ELECTRODE REM PT RTRN 9FT ADLT (ELECTROSURGICAL) ×1 IMPLANT
GAUZE SPONGE 4X4 12PLY STRL (GAUZE/BANDAGES/DRESSINGS) IMPLANT
GAUZE SPONGE 4X4 16PLY XRAY LF (GAUZE/BANDAGES/DRESSINGS) ×6 IMPLANT
GLOVE BIO SURGEON STRL SZ7.5 (GLOVE) ×3 IMPLANT
GLOVE BIO SURGEON STRL SZ8 (GLOVE) ×3 IMPLANT
GLOVE BIOGEL PI IND STRL 7.5 (GLOVE) ×1 IMPLANT
GLOVE BIOGEL PI IND STRL 8 (GLOVE) ×1 IMPLANT
GLOVE BIOGEL PI INDICATOR 7.5 (GLOVE) ×2
GLOVE BIOGEL PI INDICATOR 8 (GLOVE) ×2
GOWN STRL REUS W/ TWL LRG LVL3 (GOWN DISPOSABLE) ×2 IMPLANT
GOWN STRL REUS W/ TWL XL LVL3 (GOWN DISPOSABLE) ×1 IMPLANT
GOWN STRL REUS W/TWL LRG LVL3 (GOWN DISPOSABLE) ×4
GOWN STRL REUS W/TWL XL LVL3 (GOWN DISPOSABLE) ×2
KIT BASIN OR (CUSTOM PROCEDURE TRAY) ×3 IMPLANT
KIT ROOM TURNOVER OR (KITS) ×3 IMPLANT
MANIFOLD NEPTUNE II (INSTRUMENTS) ×3 IMPLANT
NEEDLE HYPO 21X1.5 SAFETY (NEEDLE) IMPLANT
NS IRRIG 1000ML POUR BTL (IV SOLUTION) ×3 IMPLANT
PACK GENERAL/GYN (CUSTOM PROCEDURE TRAY) IMPLANT
PACK ORTHO EXTREMITY (CUSTOM PROCEDURE TRAY) ×3 IMPLANT
PAD ABD 8X10 STRL (GAUZE/BANDAGES/DRESSINGS) ×6 IMPLANT
PAD ARMBOARD 7.5X6 YLW CONV (MISCELLANEOUS) ×6 IMPLANT
PAD CAST 4YDX4 CTTN HI CHSV (CAST SUPPLIES) ×1 IMPLANT
PADDING CAST ABS 4INX4YD NS (CAST SUPPLIES) ×2
PADDING CAST ABS 6INX4YD NS (CAST SUPPLIES) ×2
PADDING CAST ABS COTTON 4X4 ST (CAST SUPPLIES) ×1 IMPLANT
PADDING CAST ABS COTTON 6X4 NS (CAST SUPPLIES) ×1 IMPLANT
PADDING CAST COTTON 4X4 STRL (CAST SUPPLIES) ×2
PADDING CAST COTTON 6X4 STRL (CAST SUPPLIES) ×3 IMPLANT
PIN CAPS ORTHO GREEN .062 (PIN) ×6 IMPLANT
PLATE LCP 10H 84MM 2.4MM (Plate) ×3 IMPLANT
SCREW CORTEX 2.4X18 (Screw) ×3 IMPLANT
SCREW CORTEX SLFTPNG 26MM 2.4 (Screw) ×3 IMPLANT
SCREW CORTEX SLFTPNG 34MM 2.4 (Screw) ×3 IMPLANT
SCREW LOCKING SLFTPNG 30MM 2.4 (Screw) ×3 IMPLANT
SPONGE LAP 18X18 X RAY DECT (DISPOSABLE) ×3 IMPLANT
STAPLER VISISTAT 35W (STAPLE) IMPLANT
SUCTION FRAZIER HANDLE 10FR (MISCELLANEOUS) ×2
SUCTION TUBE FRAZIER 10FR DISP (MISCELLANEOUS) ×1 IMPLANT
SUT ETHILON 3 0 PS 1 (SUTURE) ×6 IMPLANT
SUT PDS AB 2-0 CT1 27 (SUTURE) IMPLANT
SUT VIC AB 2-0 CT1 27 (SUTURE) ×2
SUT VIC AB 2-0 CT1 TAPERPNT 27 (SUTURE) ×1 IMPLANT
SUT VIC AB 3-0 X1 27 (SUTURE) ×3 IMPLANT
TOWEL OR 17X24 6PK STRL BLUE (TOWEL DISPOSABLE) ×3 IMPLANT
TOWEL OR 17X26 10 PK STRL BLUE (TOWEL DISPOSABLE) ×6 IMPLANT
TUBE CONNECTING 12'X1/4 (SUCTIONS) ×1
TUBE CONNECTING 12X1/4 (SUCTIONS) ×2 IMPLANT
UNDERPAD 30X30 (UNDERPADS AND DIAPERS) ×3 IMPLANT
WATER STERILE IRR 1000ML POUR (IV SOLUTION) IMPLANT

## 2016-07-21 NOTE — Anesthesia Postprocedure Evaluation (Addendum)
Anesthesia Post Note  Patient: Daniel Medina  Procedure(s) Performed: Procedure(s) (LRB): OPEN REDUCTION INTERNAL FIXATION (ORIF) BILATERAL FEET  FRACTURE (Bilateral)  Patient location during evaluation: PACU Anesthesia Type: General Level of consciousness: awake Pain management: pain level controlled Vital Signs Assessment: post-procedure vital signs reviewed and stable Respiratory status: spontaneous breathing Cardiovascular status: stable Postop Assessment: no signs of nausea or vomiting Anesthetic complications: no        Last Vitals:  Vitals:   07/21/16 1242 07/21/16 1335  BP:    Pulse:    Resp: 17 17  Temp:      Last Pain:  Vitals:   07/21/16 1335  TempSrc:   PainSc: 7    Pain Goal:                 Darrie Macmillan JR,JOHN Parthenia Tellefsen

## 2016-07-21 NOTE — Progress Notes (Deleted)
Report called to Ebony at [facility] ([place]).  All questions answered.  
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
## 2016-07-21 NOTE — Transfer of Care (Signed)
Immediate Anesthesia Transfer of Care Note  Patient: Dorothey BasemanBrandon Sparger  Procedure(s) Performed: Procedure(s): OPEN REDUCTION INTERNAL FIXATION (ORIF) BILATERAL FEET  FRACTURE (Bilateral)  Patient Location: PACU  Anesthesia Type:General  Level of Consciousness: awake, oriented, sedated and patient cooperative  Airway & Oxygen Therapy: Patient Spontanous Breathing and Patient connected to nasal cannula oxygen  Post-op Assessment: Report given to RN, Post -op Vital signs reviewed and stable, Patient moving all extremities and Patient moving all extremities X 4  Post vital signs: Reviewed and stable  Last Vitals:  Vitals:   07/21/16 0611 07/21/16 1058  BP: 131/63 (!) 156/91  Pulse: 92 (!) 121  Resp: 17 15  Temp: 37.4 C     Last Pain:  Vitals:   07/21/16 0611  TempSrc: Oral  PainSc:          Complications: No apparent anesthesia complications

## 2016-07-21 NOTE — Progress Notes (Signed)
When patient returned from surgery this RN had asked the patient if he was able to press the PCA button.  This RN observed the patient pressing the button. Later this RN observed patient's girlfriend pressing the PCA button.  This RN explained that only the patient can press the button for pain medication otherwise we will have to change from the patient administering his own pain medication to the RN administering.  Patient's girlfriend apologized and said she was not aware but this was the only time she had done it.
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
## 2016-07-21 NOTE — Progress Notes (Signed)
Notified by bedside nurse that family requested Case Manager.  Called in room; spoke with Daniel Medina, patient's stepmother.  She has concerns about rehab versus home; need for electric wheelchair, etc.  PT/OT has not been able to work with pt yet.  Explained that we need PT/OT input prior to knowing what Daniel Medina is going to need.  Also let her know that pt may not be a candidate for inpatient rehab, as pt is NWB in all 4 extremities. Assured stepmother that we will know more once therapy sees pt, and if dc home recommended, we will work with insurance company to obtain all needed equipment.  Gave stepmother my direct number to call if she has questions.  Will follow as pt progresses.    Quintella BatonJulie W. Lillan Mccreadie, RN, BSN  Trauma/Neuro ICU Case Manager [phone]

## 2016-07-21 NOTE — Anesthesia Procedure Notes (Signed)
Procedure Name: Intubation Date/Time: 07/21/2016 8:25 AM Performed by: Izora Gala Pre-anesthesia Checklist: Patient identified, Emergency Drugs available and Patient being monitored Patient Re-evaluated:Patient Re-evaluated prior to inductionOxygen Delivery Method: Circle system utilized Preoxygenation: Pre-oxygenation with 100% oxygen Intubation Type: IV induction Ventilation: Mask ventilation without difficulty Laryngoscope Size: Miller and 3 Grade View: Grade II Tube type: Oral Tube size: 7.5 mm Number of attempts: 1 Airway Equipment and Method: Stylet and LTA kit utilized Placement Confirmation: ETT inserted through vocal cords under direct vision,  positive ETCO2 and breath sounds checked- equal and bilateral Secured at: 22 cm Tube secured with: Tape Dental Injury: Teeth and Oropharynx as per pre-operative assessment

## 2016-07-21 NOTE — Progress Notes (Signed)
PT Cancellation Note  Patient Details Name: Daniel Medina MRN: [id number] DOB: 1990/05/15   Cancelled Treatment:    Reason Eval/Treat Not Completed: Other (comment) (operative day, will plan to see next date)   [name] 07/21/2016, 1:48 PM  [name], PT [phone]

## 2016-07-22 LAB — CBC
HCT: 32.7 % — ABNORMAL LOW (ref 39.0–52.0)
Hemoglobin: 10.9 g/dL — ABNORMAL LOW (ref 13.0–17.0)
MCH: 29.2 pg (ref 26.0–34.0)
MCHC: 33.3 g/dL (ref 30.0–36.0)
MCV: 87.7 fL (ref 78.0–100.0)
PLATELETS: 205 10*3/uL (ref 150–400)
RBC: 3.73 MIL/uL — ABNORMAL LOW (ref 4.22–5.81)
RDW: 12.5 % (ref 11.5–15.5)
WBC: 8.6 10*3/uL (ref 4.0–10.5)

## 2016-07-22 LAB — BASIC METABOLIC PANEL
Anion gap: 7 (ref 5–15)
BUN: 5 mg/dL — ABNORMAL LOW (ref 6–20)
CALCIUM: 8.2 mg/dL — AB (ref 8.9–10.3)
CO2: 28 mmol/L (ref 22–32)
CREATININE: 0.99 mg/dL (ref 0.61–1.24)
Chloride: 102 mmol/L (ref 101–111)
GFR calc non Af Amer: >60 mL/min (ref >60)
GLUCOSE: 120 mg/dL — AB (ref 65–99)
Potassium: 3.8 mmol/L (ref 3.5–5.1)
Sodium: 137 mmol/L (ref 135–145)

## 2016-07-22 LAB — CK: CK TOTAL: 2679 U/L — AB (ref 49–397)

## 2016-07-22 MED ORDER — ENOXAPARIN SODIUM 40 MG/0.4ML ~~LOC~~ SOLN
40.0000 mg | SUBCUTANEOUS | Status: DC
  Administered 2016-07-23 – 2016-07-26 (×4): 40 mg via SUBCUTANEOUS
  Filled 2016-07-22 (×4): qty 0.4
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
## 2016-07-22 NOTE — Progress Notes (Signed)
Subjective: 1 Day Post-Op Procedure(s) (LRB): OPEN REDUCTION INTERNAL FIXATION (ORIF) BILATERAL FEET  FRACTURE (Bilateral) Patient reports pain as moderate.  Patient resting comfortably.  Increased pain at night, but this is tolerable.    Objective: Vital signs in last 24 hours: Temp:  [98.4 F (36.9 C)-98.7 F (37.1 C)] 98.7 F (37.1 C) (05/12 0605) Pulse Rate:  [76-121] 76 (05/12 0605) Resp:  [15-23] 22 (05/12 0400) BP: (119-156)/(58-91) 132/66 (05/12 0605) SpO2:  [95 %-100 %] 99 % (05/12 0605) FiO2 (%):  [51 %] 51 % (05/11 1242)  Intake/Output from previous day: 05/11 0701 - 05/12 0700 In: 1725 [I.V.:1625; IV Piggyback:100] Out: 1830 [Urine:1825; Blood:5] Intake/Output this shift: No intake/output data recorded.   Recent Labs  07/21/16 0519  HGB 12.0*    Recent Labs  07/21/16 0519  WBC 8.2  RBC 4.18*  HCT 36.3*  PLT 153    Recent Labs  07/20/16 0610 07/21/16 0519  NA 132* 134*  K 4.0 3.7  CL 99* 99*  CO2 24 27  BUN 9 6  CREATININE 1.21 1.11  GLUCOSE 107* 114*  CALCIUM 8.2* 8.3*   No results for input(s): LABPT, INR in the last 72 hours.  Neurologically intact Neurovascular intact Sensation intact distally  BLE and BUE splints in place No drainage to BLE Able to wiggle fingers and toes.  Minimal swelling  Assessment/Plan: 1 Day Post-Op Procedure(s) (LRB): OPEN REDUCTION INTERNAL FIXATION (ORIF) BILATERAL FEET  FRACTURE (Bilateral) PLAN NWB BLE Ice and elevate for swelling Continue SCDs and chemical ppx Continue plan per Dr. Merlyn LotKuzma for BUE Continue plan per trauma  Daniel Medina 07/22/2016, 9:56 AM

## 2016-07-22 NOTE — Progress Notes (Signed)
PT Cancellation Note  Patient Details Name: Daniel Medina MRN: [id number] DOB: 10-Aug-1990   Cancelled Treatment:    Reason Eval/Treat Not Completed: Other (comment) (working to clarify weightbearing status on UE ) Physician notes indicate NWB on bilateral UE secondary to bilateral radial fxs. Working to clarify if pt could bear weight through bilateral elbows to facilitate increased mobility.  Janielle Mittelstadt B. Beverely RisenVan Fleet PT, DPT Acute Rehabilitation  [phone] Pager [phone]   [name] 07/22/2016, 12:37 PM

## 2016-07-22 NOTE — Progress Notes (Signed)
PT and OT have evaluated pt. And are recommending IP Rehab.  Patient was screened by Weldon PickingSusan Larita Deremer for appropriateness for an Inpatient Acute Rehab consult.  At this time, we are recommending Inpatient Rehab consult.  Please order consult if you are agreeable.  Weldon PickingSusan Lamonta Cypress PT Inpatient Rehab Admissions Coordinator Cell [phone] Office [phone]
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
## 2016-07-22 NOTE — Progress Notes (Signed)
OT Cancellation   07/22/16 1200  OT Visit Information  Last OT Received On 07/22/16  Reason Eval/Treat Not Completed Other (comment) (Waiting for clarification of UE WB status through elbows to assess optimal mobilization and performance of ADLs.)   Curlene DolphinCharis Padme Arriaga, OTR/L Pager: [phone]
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
## 2016-07-23 LAB — BASIC METABOLIC PANEL
ANION GAP: 9 (ref 5–15)
BUN: 8 mg/dL (ref 6–20)
CALCIUM: 8 mg/dL — AB (ref 8.9–10.3)
CHLORIDE: 100 mmol/L — AB (ref 101–111)
CO2: 26 mmol/L (ref 22–32)
Creatinine, Ser: 1.04 mg/dL (ref 0.61–1.24)
GFR calc non Af Amer: >60 mL/min (ref >60)
Glucose, Bld: 111 mg/dL — ABNORMAL HIGH (ref 65–99)
Potassium: 4.4 mmol/L (ref 3.5–5.1)
Sodium: 135 mmol/L (ref 135–145)

## 2016-07-23 LAB — CBC
HCT: 30.8 % — ABNORMAL LOW (ref 39.0–52.0)
HEMOGLOBIN: 10.3 g/dL — AB (ref 13.0–17.0)
MCH: 29.6 pg (ref 26.0–34.0)
MCHC: 33.4 g/dL (ref 30.0–36.0)
MCV: 88.5 fL (ref 78.0–100.0)
Platelets: 225 10*3/uL (ref 150–400)
RBC: 3.48 MIL/uL — ABNORMAL LOW (ref 4.22–5.81)
RDW: 12.9 % (ref 11.5–15.5)
WBC: 7.8 10*3/uL (ref 4.0–10.5)

## 2016-07-23 MED ORDER — SENNOSIDES-DOCUSATE SODIUM 8.6-50 MG PO TABS
2.0000 | ORAL_TABLET | Freq: Two times a day (BID) | ORAL | Status: DC
  Administered 2016-07-23 – 2016-07-27 (×9): 2 via ORAL
  Filled 2016-07-23 (×9): qty 2

## 2016-07-23 MED ORDER — POLYETHYLENE GLYCOL 3350 17 G PO PACK
17.0000 g | PACK | Freq: Every day | ORAL | Status: AC
  Administered 2016-07-23 – 2016-07-24 (×2): 17 g via ORAL
  Filled 2016-07-23 (×2): qty 1
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
## 2016-07-23 NOTE — Progress Notes (Signed)
Subjective: 2 Days Post-Op Procedure(s) (LRB): OPEN REDUCTION INTERNAL FIXATION (ORIF) BILATERAL FEET  FRACTURE (Bilateral) Patient reports pain as mild.  Patient resting comfortably.    Objective: Vital signs in last 24 hours: Temp:  [98.3 F (36.8 C)-99.9 F (37.7 C)] 98.3 F (36.8 C) (05/13 0601) Pulse Rate:  [68-85] 68 (05/13 0601) Resp:  [16-23] 16 (05/13 0601) BP: (131-147)/(67-75) 131/73 (05/13 0601) SpO2:  [92 %-100 %] 100 % (05/13 0601)  Intake/Output from previous day: 05/12 0701 - 05/13 0700 In: 480 [P.O.:480] Out: 3250 [Urine:3250] Intake/Output this shift: No intake/output data recorded.   Recent Labs  07/21/16 0519 07/22/16 1120 07/23/16 0538  HGB 12.0* 10.9* 10.3*    Recent Labs  07/22/16 1120 07/23/16 0538  WBC 8.6 7.8  RBC 3.73* 3.48*  HCT 32.7* 30.8*  PLT 205 225    Recent Labs  07/22/16 1120 07/23/16 0538  NA 137 135  K 3.8 4.4  CL 102 100*  CO2 28 26  BUN 5* 8  CREATININE 0.99 1.04  GLUCOSE 120* 111*  CALCIUM 8.2* 8.0*   No results for input(s): LABPT, INR in the last 72 hours.  Neurologically intact Neurovascular intact Sensation intact distally  Splints in place to BLE/BUE Able to wiggle toes.  Minimal swelling  Assessment/Plan: 2 Days Post-Op Procedure(s) (LRB): OPEN REDUCTION INTERNAL FIXATION (ORIF) BILATERAL FEET  FRACTURE (Bilateral) Advance diet Up with therapy  NWB BLE Ice and elevate for swelling Continue scd's and chemical ppx for dvt prevention Continue plan per Dr. Merlyn LotKuzma for BUE Continue plan per trauma  Otilio SaberM Lindsey Stanbery 07/23/2016, 10:45 AM

## 2016-07-23 NOTE — Progress Notes (Signed)
2 Days Post-Op  Subjective: Stable and alert. Pain control adequate Voiding No bowel movement yet  Hemoglobin stable, so started on Lovenox for DVT prophylaxis SPO2 100%  Objective: Vital signs in last 24 hours: Temp:  [98.3 F (36.8 C)-99.9 F (37.7 C)] 98.3 F (36.8 C) (05/13 0601) Pulse Rate:  [68-85] 68 (05/13 0601) Resp:  [16-23] 16 (05/13 0601) BP: (131-147)/(67-75) 131/73 (05/13 0601) SpO2:  [92 %-100 %] 100 % (05/13 0601) Last BM Date: 07/18/16  Intake/Output from previous day: 05/12 0701 - 05/13 0700 In: 480 [P.O.:480] Out: 3250 [Urine:3250] Intake/Output this shift: No intake/output data recorded.  General appearance: Alert.  Supine in bed.  Family member with him.  Very talkative.  Minimal distress. Resp: clear to auscultation bilaterally GI: soft, non-tender; bowel sounds normal; no masses,  no organomegaly Extremities: All 4 extremities in cast or splint.  Fingers and toes with grossly normal neurovascular function.  Lab Results:   Recent Labs  07/22/16 1120 07/23/16 0538  WBC 8.6 7.8  HGB 10.9* 10.3*  HCT 32.7* 30.8*  PLT 205 225   BMET  Recent Labs  07/22/16 1120 07/23/16 0538  NA 137 135  K 3.8 4.4  CL 102 100*  CO2 28 26  GLUCOSE 120* 111*  BUN 5* 8  CREATININE 0.99 1.04  CALCIUM 8.2* 8.0*   PT/INR No results for input(s): LABPROT, INR in the last 72 hours. ABG No results for input(s): PHART, HCO3 in the last 72 hours.  Invalid input(s): PCO2, PO2  Studies/Results: No results found.  Anti-infectives: Anti-infectives    Start     Dose/Rate Route Frequency Ordered Stop   07/21/16 1500  ceFAZolin (ANCEF) IVPB 2g/100 mL premix     2 g 200 mL/hr over 30 Minutes Intravenous Every 8 hours 07/21/16 1223 07/22/16 0938   07/21/16 0730  ceFAZolin (ANCEF) IVPB 2g/100 mL premix     2 g 200 mL/hr over 30 Minutes Intravenous To ShortStay [facility] 07/20/16 1105 07/21/16 0845      Assessment/Plan: s/p Procedure(s): OPEN REDUCTION  INTERNAL FIXATION (ORIF) BILATERAL FEET  FRACTURE  [facility] Endplate fractures T6-T9- per NS, nonop, rec Aspen TLSO brace and outpatient f/u in 4 weeks Right DRF- s/p closed reduction 5/8 Dr. Merlyn LotKuzma; NWB in splint for now, will eventually need operative fixation. Left DRF- s/p closed reduction 5/8 Dr. Merlyn LotKuzma; NWB in splint for now, will eventually need operative fixation Left base MT 1-5 fractures, avulsion fracture lateral cuneiform, left proximal phalanx of 5th toe fracture- S/P fixation byDr. Carola FrostHandy; NWB 6-8 weeks  Right foot Lisfranc injury, medial cuneiform fracture, avulsion fractures MT 1 and 3-5 and lateral cuneiform- S/P fixation byDr. Carola FrostHandy, Will remain NWB x 8 wks AKI - resolved withIVF rehydration  ID - none FEN - IVF, Advance diet as tolerated, Senokot and MiraLAX ordered for constipation VTE - SCDs, continue to hold pharm prophylaxis for now Pain - scheduled Tylenol, Dilaudid PCA, Oxy IR 5 mg, Robaxin 1,000mg   Plan - start PT/OT;  I think he is an excellent candidate for inpatient rehabilitation.  I have put in a consult for inpatient rehabilitation.  I will ask Dr. Betha LoaKevin Kuzma to begin formulating a plan and timing for repair of his distal radius fractures.  Perhaps this can be done while he is on inpatient rehabilitation.    LOS: 3 days    Daniel Medina M 07/23/2016

## 2016-07-24 ENCOUNTER — Encounter (HOSPITAL_COMMUNITY): Payer: Self-pay | Admitting: Orthopedic Surgery

## 2016-07-24 DIAGNOSIS — F1010 Alcohol abuse, uncomplicated: Secondary | ICD-10-CM

## 2016-07-24 DIAGNOSIS — S92901D Unspecified fracture of right foot, subsequent encounter for fracture with routine healing: Secondary | ICD-10-CM

## 2016-07-24 DIAGNOSIS — M25562 Pain in left knee: Secondary | ICD-10-CM

## 2016-07-24 DIAGNOSIS — G8918 Other acute postprocedural pain: Secondary | ICD-10-CM

## 2016-07-24 DIAGNOSIS — R739 Hyperglycemia, unspecified: Secondary | ICD-10-CM

## 2016-07-24 DIAGNOSIS — S52502K Unspecified fracture of the lower end of left radius, subsequent encounter for closed fracture with nonunion: Secondary | ICD-10-CM

## 2016-07-24 DIAGNOSIS — M25561 Pain in right knee: Secondary | ICD-10-CM

## 2016-07-24 DIAGNOSIS — Z720 Tobacco use: Secondary | ICD-10-CM

## 2016-07-24 DIAGNOSIS — T148XXA Other injury of unspecified body region, initial encounter: Secondary | ICD-10-CM

## 2016-07-24 DIAGNOSIS — S52501K Unspecified fracture of the lower end of right radius, subsequent encounter for closed fracture with nonunion: Secondary | ICD-10-CM

## 2016-07-24 DIAGNOSIS — I10 Essential (primary) hypertension: Secondary | ICD-10-CM

## 2016-07-24 DIAGNOSIS — D62 Acute posthemorrhagic anemia: Secondary | ICD-10-CM

## 2016-07-24 MED ORDER — OXYCODONE HCL 5 MG PO TABS
5.0000 mg | ORAL_TABLET | ORAL | Status: DC | PRN
  Filled 2016-07-24: qty 2

## 2016-07-24 MED ORDER — HYDROMORPHONE HCL 1 MG/ML IJ SOLN
1.0000 mg | INTRAMUSCULAR | Status: DC | PRN
  Administered 2016-07-24 – 2016-07-27 (×22): 1 mg via INTRAVENOUS
  Filled 2016-07-24 (×22): qty 1

## 2016-07-24 NOTE — Progress Notes (Signed)
I met with pt and his Dad at bedside to discuss Dr. Serita Grit recommendation for Home with [facility] after pt/family education with acute therapy staff and DME acquisition or SNF if not felt able to manage with NWB for all 4 extremities at home. Patient disappointed for states 5 doctors have told him he was a candidate. Dad asking if aqua therapy in [place] would be an option. I referred the Aqua therapy questions to the MDs involved in his care. I contacted RN CM of our recommendation. We will sign off at this time. [phone]
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
## 2016-07-24 NOTE — Progress Notes (Signed)
Met with pt and his father regarding discharge planning and CIR declining admission.  They are naturally disappointed.  We discussed other rehab facilities available, including [facility], [facility] and [facility] in [place].  Dad initially only wanting to consider [facility] due to it being very close to them, but I advised them not to limit their options, and at least consider [place] agree to referral to [facility] and [facility].  I mentioned to father that we may need to consider SNF as backup plan, as these rehab centers may have the same criteria as ours.  He is willing to consider this; I have consulted CSW to meet with them on 5/15 to discuss backup plan of SNF.  Will as updates are available.    Reinaldo Raddle, RN, BSN  Trauma/Neuro ICU Case Manager [phone]

## 2016-07-24 NOTE — Social Work (Signed)
CSW consulted with patient this day and completed SBIRT screening. Patient scored low on screening and declined issues with alcohol or treatment interventions at this time.
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
## 2016-07-24 NOTE — Progress Notes (Signed)
Orthopedic Tech Progress Note Patient Details:  Daniel Medina 1990/03/31 [id number]  Patient ID: Daniel BasemanBrandon Bitterman, male   DOB: 1990/03/31, 26 y.o.   MRN: [id number]   [name] 07/24/2016, 4:53 PMCalled [employer] for TLSO brace.
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
## 2016-07-24 NOTE — Progress Notes (Signed)
PT Cancellation Note  Patient Details Name: Daniel Medina MRN: [id number] DOB: [birth year]   Cancelled Treatment:    Reason Eval/Treat Not Completed: Other (comment) (TLSO not ordered, spoke with [name], PA will order)   Courtney ParisElizabeth B. Beverely RisenVan Fleet PT, DPT Acute Rehabilitation  [phone] Pager [phone]    Elon Alaslizabeth B Van Fleet 07/24/2016, 5:22 PM

## 2016-07-25 LAB — BASIC METABOLIC PANEL
Anion gap: 11 (ref 5–15)
BUN: 14 mg/dL (ref 6–20)
CHLORIDE: 100 mmol/L — AB (ref 101–111)
CO2: 23 mmol/L (ref 22–32)
Calcium: 8.8 mg/dL — ABNORMAL LOW (ref 8.9–10.3)
Creatinine, Ser: 1 mg/dL (ref 0.61–1.24)
GFR calc Af Amer: >60 mL/min (ref >60)
GFR calc non Af Amer: >60 mL/min (ref >60)
Glucose, Bld: 114 mg/dL — ABNORMAL HIGH (ref 65–99)
POTASSIUM: 4.3 mmol/L (ref 3.5–5.1)
SODIUM: 134 mmol/L — AB (ref 135–145)

## 2016-07-25 LAB — CBC
HEMATOCRIT: 35.8 % — AB (ref 39.0–52.0)
HEMOGLOBIN: 11.8 g/dL — AB (ref 13.0–17.0)
MCH: 28.9 pg (ref 26.0–34.0)
MCHC: 33 g/dL (ref 30.0–36.0)
MCV: 87.7 fL (ref 78.0–100.0)
Platelets: 340 10*3/uL (ref 150–400)
RBC: 4.08 MIL/uL — ABNORMAL LOW (ref 4.22–5.81)
RDW: 12.6 % (ref 11.5–15.5)
WBC: 9.5 10*3/uL (ref 4.0–10.5)

## 2016-07-25 LAB — CK: Total CK: 702 U/L — ABNORMAL HIGH (ref 49–397)
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
## 2016-07-25 NOTE — Progress Notes (Signed)
[facility].  [facility] currently has no beds available, and has a waiting list for admissions.  [facility] has reviewed this pt's clinical information, and declines admission due to pt not being able to tolerate intensive therapy requirements.  Notified pt of this; presented options of discharging home vs SNF for rehab.  He states he will need to speak with his father.  Will follow up with pt and family later today with their decision.    Quintella BatonJulie W. Scheryl Sanborn, RN, BSN  Trauma/Neuro ICU Case Manager [phone]

## 2016-07-25 NOTE — Progress Notes (Signed)
Orthopedic Tech Progress Note Patient Details:  Daniel BasemanBrandon Medina 02-Jan-1991 [id number]  Ortho Devices Type of Ortho Device: Ace wrap, Sugartong splint Finger Trap Weight: bilateral sugartong splints Ortho Device/Splint Location: Bilateral Wrists  (Left and right)   Provided Dr. Philip AspenMike Jefferys ER Doctor with supplies to apply Short Arm Splints to pt left and right wrist.  (Items used: plaster, webrolls, ace bandage wraps, and Arm Slings) Ortho Device/Splint Interventions: Application   Daniel FordyceJennifer C Taylan Medina 07/25/2016, 3:29 PM

## 2016-07-25 NOTE — Progress Notes (Signed)
Met with pt and father regarding disposition issues.  Father seems overwhelmed; feels that pt has made improvements daily, and thinks that pt needs to progress more physically before going home.  They are both concerned with the logistics of having hand surgery on Thursday at [facility], and how this will all work.  Both pt and father are agreeable to SNF for rehab at this time, and agree for pt to be faxed out for bed offers.  I have referred pt to [place] for bed search.  Depending on bed availability, pt may be able to dc to SNF and go to surgery on Thursday as OP.  Will follow progress.    Reinaldo Raddle, RN, BSN  Trauma/Neuro ICU Case Manager [phone]

## 2016-07-25 NOTE — Progress Notes (Signed)
Subjective: Wrists comfortable.  New splints have been placed.  No complaints.   Objective: Vital signs in last 24 hours: Temp:  [98.2 F (36.8 C)-98.5 F (36.9 C)] 98.2 F (36.8 C) (05/15 0605) Pulse Rate:  [93-96] 96 (05/15 0605) Resp:  [18] 18 (05/15 0605) BP: (138-144)/(78-84) 144/84 (05/15 0605) SpO2:  [96 %-97 %] 97 % (05/15 0605)  Intake/Output from previous day: 05/14 0701 - 05/15 0700 In: 120 [P.O.:120] Out: 2750 [Urine:2750] Intake/Output this shift: No intake/output data recorded.   Recent Labs  07/23/16 0538 07/25/16 0549  HGB 10.3* 11.8*    Recent Labs  07/23/16 0538 07/25/16 0549  WBC 7.8 9.5  RBC 3.48* 4.08*  HCT 30.8* 35.8*  PLT 225 340    Recent Labs  07/23/16 0538 07/25/16 0549  NA 135 134*  K 4.4 4.3  CL 100* 100*  CO2 26 23  BUN 8 14  CREATININE 1.04 1.00  GLUCOSE 111* 114*  CALCIUM 8.0* 8.8*   No results for input(s): LABPT, INR in the last 72 hours.  intact sensation and capillary refill all digits except that left index and long still with some tingling, but improved.  moving all digits.  Assessment/Plan: Bilateral distal radius fractures.  Discussed surgical plan for bilateral bridge plating and expected recovery/rehabilitation.  Discussed risks of surgery including risks of blood loss, infection, damage to nerves/vessels/tendon/ligament/bone, failure of surgery, need for additional surgery, complications with wound healing, and stiffness.  [facility].    Oaklynn Stierwalt R 07/25/2016, 4:20 PM
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
## 2016-07-25 NOTE — Clinical Social Work Note (Signed)
Clinical Social Work Assessment  Patient Details  Name: Daniel Medina MRN: [id number] Date of Birth: 1990/12/29  Date of referral:  07/25/16               Reason for consult:  Facility Placement                Permission sought to share information with:  Facility Medical sales representativeContact Representative, Family Supports Permission granted to share information::  Yes, Verbal Permission Granted  Name::     Psychologist, sport and exerciseBryan  Agency::  SNFs  Relationship::  Father  Contact Information:  [phone]  Housing/Transportation Living arrangements for the past 2 months:  Single Family Home Source of Information:  Patient, Parent Patient Interpreter Needed:  None Criminal Activity/Legal Involvement Pertinent to Current Situation/Hospitalization:  No - Comment as needed Significant Relationships:  Parents, Siblings Lives with:  Parents Do you feel safe going back to the place where you live?  No Need for family participation in patient care:  Yes (Comment)  Care giving concerns:  CSW received consult for possible SNF placement at time of discharge. CSW spoke with patient and patient's father at bedside regarding PT recommendation of SNF placement at time of discharge. Patient reported that patient's parents are currently unable to care for patient at their home given patient's current physical needs. Patient expressed understanding of PT recommendation and is agreeable to SNF placement at time of discharge. CSW to continue to follow and assist with discharge planning needs.   Social Worker assessment / plan: CSW spoke with patient concerning possibility [facility] before returning home.  Employment status:  Other (Comment) Insurance information:  Managed Care PT Recommendations:  Skilled Nursing Facility, Inpatient Rehab Consult Information / Referral to community resources:  Skilled Nursing Facility  Patient/Family's Response to care:  Patient recognizes need for rehab before returning home and [place] agreeable [place]. Patient's [place]. CSW explained that patient's insurance benefits would need to be checked. Patient expressed understanding.   Patient/Family's Understanding of and Emotional Response to Diagnosis, Current Treatment, and Prognosis:  Patient/family is realistic regarding therapy needs and expressed being hopeful for SNF placement until patient is stronger to come home. Patient expressed understanding of CSW role and discharge process as well as his medical needs. No questions/concerns about plan or treatment.    Emotional Assessment Appearance:  Appears stated age Attitude/Demeanor/Rapport:  Other (Appropriate) Affect (typically observed):  Accepting, Appropriate Orientation:  Oriented to Self, Oriented to Place, Oriented to Situation, Oriented to  Time Alcohol / Substance use:  Not Applicable Psych involvement (Current and /or in the community):  No (Comment)  Discharge Needs  Concerns to be addressed:  Care Coordination Readmission within the last 30 days:  No Current discharge risk:  Dependent with Mobility Barriers to Discharge:  Continued Medical Work up   [employer]ia S Angelino Rumery, LCSWA 07/25/2016, 4:59 PM
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
## 2016-07-26 ENCOUNTER — Other Ambulatory Visit: Payer: Self-pay | Admitting: Orthopedic Surgery

## 2016-07-26 ENCOUNTER — Encounter (HOSPITAL_BASED_OUTPATIENT_CLINIC_OR_DEPARTMENT_OTHER): Payer: Self-pay | Admitting: *Deleted

## 2016-07-26 LAB — CBC
HEMATOCRIT: 37.5 % — AB (ref 39.0–52.0)
Hemoglobin: 12.5 g/dL — ABNORMAL LOW (ref 13.0–17.0)
MCH: 29.1 pg (ref 26.0–34.0)
MCHC: 33.3 g/dL (ref 30.0–36.0)
MCV: 87.4 fL (ref 78.0–100.0)
Platelets: 381 10*3/uL (ref 150–400)
RBC: 4.29 MIL/uL (ref 4.22–5.81)
RDW: 12.6 % (ref 11.5–15.5)
WBC: 10.4 10*3/uL (ref 4.0–10.5)

## 2016-07-26 NOTE — Progress Notes (Signed)
Trauma Service Note  Subjective: Trying to work out the logistics for surgery tomorrow and subsequent disposition.  The patient and his family are very cooperative.  They need to have a full understanding of what financial obligations the patient will have.  Objective: Vital signs in last 24 hours: Temp:  [97.9 F (36.6 C)-98.3 F (36.8 C)] 97.9 F (36.6 C) (05/16 0500) Pulse Rate:  [88-96] 96 (05/16 0500) Resp:  [18] 18 (05/16 0500) BP: (134-142)/(75-83) 138/83 (05/16 0500) SpO2:  [95 %-100 %] 95 % (05/16 0500) Weight:  [108.9 kg (240 lb)] 108.9 kg (240 lb) (05/16 0940) Last BM Date: 07/18/16  Intake/Output from previous day: 05/15 0701 - 05/16 0700 In: 740 [P.O.:480; I.V.:260] Out: 1750 [Urine:1750] Intake/Output this shift: No intake/output data recorded.  General: No acute distress.  Has not   Lungs: Clear  Abd: Benign  Extremities: Splinted bilateral upper and lower extremities.  Neuro: Intact  Lab Results: CBC   Recent Labs  07/25/16 0549 07/26/16 0921  WBC 9.5 10.4  HGB 11.8* 12.5*  HCT 35.8* 37.5*  PLT 340 381   BMET  Recent Labs  07/25/16 0549  NA 134*  K 4.3  CL 100*  CO2 23  GLUCOSE 114*  BUN 14  CREATININE 1.00  CALCIUM 8.8*   PT/INR No results for input(s): LABPROT, INR in the last 72 hours. ABG No results for input(s): PHART, HCO3 in the last 72 hours.  Invalid input(s): PCO2, PO2  Studies/Results: No results found.  Anti-infectives: Anti-infectives    Start     Dose/Rate Route Frequency Ordered Stop   07/21/16 1500  ceFAZolin (ANCEF) IVPB 2g/100 mL premix     2 g 200 mL/hr over 30 Minutes Intravenous Every 8 hours 07/21/16 1223 07/22/16 0938   07/21/16 0730  ceFAZolin (ANCEF) IVPB 2g/100 mL premix     2 g 200 mL/hr over 30 Minutes Intravenous To ShortStay Surgical 07/20/16 1105 07/21/16 0845      Assessment/Plan: s/p Procedure(s): OPEN REDUCTION INTERNAL FIXATION (ORIF) BILATERAL FEET  FRACTURE Surgery on upper  extremities planned for tomorrow.  Disposition after surgery still in question  LOS: 6 days   Marta LamasJames O. Gae BonWyatt, III, MD, FACS [phone] Trauma Surgeon 07/26/2016

## 2016-07-26 NOTE — Progress Notes (Signed)
Received a call from Leann at the day surgery center regarding patient's hand surgery scheduled for 07/27/16. Notified by her that the patient will be marked as "off the floor" for the duration of the surgery, and will be transported back to his room on [address] via [facility] after the surgery is completed. She said [facility] team. This however conflicts with the plan laid out in the case management note by Sidney AceJulie Amerson. Leann left me a contact number of [phone].  Please confirm with [facility] [place] nursing staff, the patient, and his family what the correct plan is for tomorrow after the surgery is complete.
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
## 2016-07-26 NOTE — Discharge Instructions (Addendum)
Orthopaedic instructions  Nonweightbearing Bilateral legs Do not remove splints Keep splints clean and dry     1. PAIN CONTROL:  1. Ice packs (30-60 minutes up to 6 times a day) will help.  2. DO NOT TAKE tylenol or acetaminophen while taking the Vicodin/Norco. This medication has acetaminophen in it. Refer to your orthopedic surgeon for additional pain control.  3. A prescription for pain medication (such as oxycodone, hydrocodone, etc) should be given to you upon discharge. Take your pain medication as prescribed.  1. If you are having problems/concerns with the prescription medicine (does not control pain, nausea, vomiting, rash, itching, etc), please call us [phone] to see if we need to switch you to a different pain medicine that will work better for you and/or control your side effect better. 2. If you need a refill on your pain medication, please contact your pharmacy. They will contact our office to request authorization. Prescriptions will not be filled after 5 pm or on week-ends. 4. Avoid getting constipated. When taking pain medications, it is common to experience some constipation. Increasing fluid intake and taking a fiber supplement (such as Metamucil, Citrucel, FiberCon, MiraLax, etc) 1-2 times a day regularly will usually help prevent this problem from occurring. A mild laxative (prune juice, Milk of Magnesia, MiraLax, etc) should be taken according to package directions if there are no bowel movements after 48 hours.  5. Watch out for diarrhea. If you have many loose bowel movements, simplify your diet to bland foods & liquids for a few days. Stop any stool softeners and decrease your fiber supplement. Switching to mild anti-diarrheal medications (Kayopectate, Pepto Bismol) can help. If this worsens or does not improve, please call us.  WHEN TO CALL US [phone]:  1. Poor pain control 2. Reactions / problems with new medications (rash/itching, nausea, etc)  3. Fever  over 101.5 F (38.5 C) 4. Worsening swelling or bruising 5. New numbness/tingling or weakness  The clinic staff is available to answer your questions during regular business hours (8:30am-5pm). Please dont hesitate to call and ask to speak to one of our nurses for clinical concerns.  If you have a medical emergency, go to the nearest emergency room or call 911.  A surgeon from [facility] is always on call at the [facility]   [facility] [place]  [address], [address], [place], [place] [address] ?  MAIN: [phone] ? TOLL FREE: [phone] ?  FAX [phone]  [email or web address]

## 2016-07-26 NOTE — Progress Notes (Signed)
CSW [facility]'s father [facility] cost. Patient's father asked about Home health cost in the event that patient can't afford SNF. RNCM alerted.  Daniel Medina LCSWA [phone]
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
## 2016-07-27 ENCOUNTER — Ambulatory Visit (HOSPITAL_BASED_OUTPATIENT_CLINIC_OR_DEPARTMENT_OTHER)
Admission: RE | Admit: 2016-07-27 | Discharge: 2016-07-28 | Disposition: A | Discharge status: Home / Self Care | Payer: 59 | Source: Ambulatory Visit | Attending: Orthopedic Surgery | Admitting: Orthopedic Surgery

## 2016-07-27 ENCOUNTER — Encounter (HOSPITAL_BASED_OUTPATIENT_CLINIC_OR_DEPARTMENT_OTHER): Payer: Self-pay | Admitting: Anesthesiology

## 2016-07-27 ENCOUNTER — Ambulatory Visit (HOSPITAL_BASED_OUTPATIENT_CLINIC_OR_DEPARTMENT_OTHER): Payer: 59 | Admitting: Anesthesiology

## 2016-07-27 ENCOUNTER — Encounter (HOSPITAL_COMMUNITY): Payer: Self-pay

## 2016-07-27 ENCOUNTER — Encounter (HOSPITAL_BASED_OUTPATIENT_CLINIC_OR_DEPARTMENT_OTHER)
Admission: RE | Disposition: A | Discharge status: Home / Self Care | Payer: Self-pay | Source: Ambulatory Visit | Attending: Orthopedic Surgery

## 2016-07-27 DIAGNOSIS — G5602 Carpal tunnel syndrome, left upper limb: Secondary | ICD-10-CM | POA: Insufficient documentation

## 2016-07-27 DIAGNOSIS — S52571A Other intraarticular fracture of lower end of right radius, initial encounter for closed fracture: Secondary | ICD-10-CM | POA: Diagnosis present

## 2016-07-27 DIAGNOSIS — F17210 Nicotine dependence, cigarettes, uncomplicated: Secondary | ICD-10-CM | POA: Insufficient documentation

## 2016-07-27 HISTORY — PX: OPEN REDUCTION INTERNAL FIXATION (ORIF) DISTAL RADIAL FRACTURE: SHX5989

## 2016-07-27 HISTORY — PX: CARPAL TUNNEL RELEASE: SHX101

## 2016-07-27 HISTORY — DX: Other specified health status: Z78.9

## 2016-07-27 LAB — SURGICAL PCR SCREEN

## 2016-07-27 SURGERY — OPEN REDUCTION INTERNAL FIXATION (ORIF) DISTAL RADIUS FRACTURE
Anesthesia: General | Site: Wrist | Laterality: Left

## 2016-07-27 MED ORDER — ONDANSETRON HCL 4 MG/2ML IJ SOLN
INTRAMUSCULAR | Status: DC | PRN
  Administered 2016-07-27: 4 mg via INTRAVENOUS

## 2016-07-27 MED ORDER — LACTATED RINGERS IV SOLN
INTRAVENOUS | Status: DC
  Administered 2016-07-27: 20:00:00 via INTRAVENOUS

## 2016-07-27 MED ORDER — MIDAZOLAM HCL 2 MG/2ML IJ SOLN: 1.0000 mg | INTRAMUSCULAR | Status: DC | PRN

## 2016-07-27 MED ORDER — HYDROMORPHONE HCL 1 MG/ML IJ SOLN
INTRAMUSCULAR | Status: AC
  Filled 2016-07-27: qty 1

## 2016-07-27 MED ORDER — HYDROMORPHONE HCL 1 MG/ML IJ SOLN
0.2500 mg | INTRAMUSCULAR | Status: DC | PRN
  Administered 2016-07-27 (×4): 0.5 mg via INTRAVENOUS

## 2016-07-27 MED ORDER — FENTANYL CITRATE (PF) 100 MCG/2ML IJ SOLN
INTRAMUSCULAR | Status: DC | PRN
  Administered 2016-07-27 (×4): 50 ug via INTRAVENOUS
  Administered 2016-07-27: 100 ug via INTRAVENOUS

## 2016-07-27 MED ORDER — CEFAZOLIN SODIUM-DEXTROSE 1-4 GM/50ML-% IV SOLN
1.0000 g | Freq: Four times a day (QID) | INTRAVENOUS | Status: DC
  Administered 2016-07-28 (×2): 1 g via INTRAVENOUS
  Filled 2016-07-27 (×2): qty 50

## 2016-07-27 MED ORDER — CHLORHEXIDINE GLUCONATE 4 % EX LIQD: 60.0000 mL | Freq: Once | CUTANEOUS | Status: DC

## 2016-07-27 MED ORDER — ONDANSETRON HCL 4 MG/2ML IJ SOLN: 4.0000 mg | Freq: Once | INTRAMUSCULAR | Status: DC

## 2016-07-27 MED ORDER — METOCLOPRAMIDE HCL 5 MG PO TABS: 5.0000 mg | ORAL_TABLET | Freq: Three times a day (TID) | ORAL | Status: DC | PRN

## 2016-07-27 MED ORDER — CEFAZOLIN SODIUM-DEXTROSE 2-4 GM/100ML-% IV SOLN
2.0000 g | INTRAVENOUS | Status: AC
  Administered 2016-07-27 (×2): 2 g via INTRAVENOUS

## 2016-07-27 MED ORDER — MEPERIDINE HCL 25 MG/ML IJ SOLN: 6.2500 mg | INTRAMUSCULAR | Status: DC | PRN

## 2016-07-27 MED ORDER — LACTATED RINGERS IV SOLN
INTRAVENOUS | Status: DC
  Administered 2016-07-27: 21:00:00 via INTRAVENOUS

## 2016-07-27 MED ORDER — METOCLOPRAMIDE HCL 5 MG/ML IJ SOLN: 5.0000 mg | Freq: Three times a day (TID) | INTRAMUSCULAR | Status: DC | PRN

## 2016-07-27 MED ORDER — MIDAZOLAM HCL 2 MG/2ML IJ SOLN
INTRAMUSCULAR | Status: AC
  Filled 2016-07-27: qty 2

## 2016-07-27 MED ORDER — ONDANSETRON HCL 4 MG PO TABS: 4.0000 mg | ORAL_TABLET | Freq: Four times a day (QID) | ORAL | Status: DC | PRN

## 2016-07-27 MED ORDER — CEFAZOLIN SODIUM-DEXTROSE 2-4 GM/100ML-% IV SOLN
INTRAVENOUS | Status: AC
  Filled 2016-07-27: qty 100

## 2016-07-27 MED ORDER — FENTANYL CITRATE (PF) 100 MCG/2ML IJ SOLN: 50.0000 ug | INTRAMUSCULAR | Status: DC | PRN

## 2016-07-27 MED ORDER — PROMETHAZINE HCL 25 MG/ML IJ SOLN
INTRAMUSCULAR | Status: AC
  Filled 2016-07-27: qty 1

## 2016-07-27 MED ORDER — BUPIVACAINE HCL (PF) 0.25 % IJ SOLN
INTRAMUSCULAR | Status: DC | PRN
  Administered 2016-07-27: 20 mL

## 2016-07-27 MED ORDER — METHOCARBAMOL 1000 MG/10ML IJ SOLN: 500.0000 mg | Freq: Four times a day (QID) | INTRAVENOUS | Status: DC | PRN

## 2016-07-27 MED ORDER — OXYCODONE-ACETAMINOPHEN 5-325 MG PO TABS
1.0000 | ORAL_TABLET | ORAL | Status: DC | PRN
  Administered 2016-07-28: 2 via ORAL
  Filled 2016-07-27: qty 2

## 2016-07-27 MED ORDER — FENTANYL CITRATE (PF) 100 MCG/2ML IJ SOLN
INTRAMUSCULAR | Status: AC
  Filled 2016-07-27: qty 2

## 2016-07-27 MED ORDER — OXYCODONE-ACETAMINOPHEN 5-325 MG PO TABS: ORAL_TABLET | ORAL | 0 refills | Status: DC

## 2016-07-27 MED ORDER — CEFAZOLIN SODIUM-DEXTROSE 2-4 GM/100ML-% IV SOLN: 2.0000 g | INTRAVENOUS | Status: DC

## 2016-07-27 MED ORDER — LIDOCAINE HCL (CARDIAC) 20 MG/ML IV SOLN
INTRAVENOUS | Status: DC | PRN
  Administered 2016-07-27: 100 mg via INTRAVENOUS

## 2016-07-27 MED ORDER — HYDROMORPHONE HCL 1 MG/ML IJ SOLN: 0.2500 mg | INTRAMUSCULAR | Status: DC | PRN

## 2016-07-27 MED ORDER — PROPOFOL 10 MG/ML IV BOLUS
INTRAVENOUS | Status: DC | PRN
  Administered 2016-07-27: 200 mg via INTRAVENOUS
  Administered 2016-07-27: 100 mg via INTRAVENOUS

## 2016-07-27 MED ORDER — SCOPOLAMINE 1 MG/3DAYS TD PT72: 1.0000 | MEDICATED_PATCH | Freq: Once | TRANSDERMAL | Status: DC

## 2016-07-27 MED ORDER — METHOCARBAMOL 500 MG PO TABS: 1000.0000 mg | ORAL_TABLET | Freq: Four times a day (QID) | ORAL | 0 refills | Status: DC

## 2016-07-27 MED ORDER — PROMETHAZINE HCL 25 MG/ML IJ SOLN
6.2500 mg | INTRAMUSCULAR | Status: DC | PRN
Start: 2016-07-27 — End: 2016-07-28
  Administered 2016-07-27: 6.25 mg via INTRAVENOUS

## 2016-07-27 MED ORDER — KETOROLAC TROMETHAMINE 30 MG/ML IJ SOLN
INTRAMUSCULAR | Status: DC | PRN
  Administered 2016-07-27: 30 mg via INTRAVENOUS

## 2016-07-27 MED ORDER — ENOXAPARIN SODIUM 40 MG/0.4ML ~~LOC~~ SOLN: 40.0000 mg | SUBCUTANEOUS | 0 refills | Status: DC

## 2016-07-27 MED ORDER — LACTATED RINGERS IV SOLN: INTRAVENOUS | Status: DC

## 2016-07-27 MED ORDER — HYDROCODONE-ACETAMINOPHEN 5-325 MG PO TABS
1.0000 | ORAL_TABLET | ORAL | Status: DC | PRN
  Administered 2016-07-27: 2 via ORAL
  Filled 2016-07-27: qty 2

## 2016-07-27 MED ORDER — SCOPOLAMINE 1 MG/3DAYS TD PT72: 1.0000 | MEDICATED_PATCH | Freq: Once | TRANSDERMAL | Status: DC | PRN

## 2016-07-27 MED ORDER — HYDROCODONE-ACETAMINOPHEN 5-325 MG PO TABS: 1.0000 | ORAL_TABLET | ORAL | 0 refills | Status: DC | PRN

## 2016-07-27 MED ORDER — LACTATED RINGERS IV SOLN
INTRAVENOUS | Status: DC
  Administered 2016-07-27: 10 mL/h via INTRAVENOUS
  Administered 2016-07-27: 16:00:00 via INTRAVENOUS

## 2016-07-27 MED ORDER — HYDROMORPHONE HCL 1 MG/ML IJ SOLN
0.5000 mg | INTRAMUSCULAR | Status: DC | PRN
  Administered 2016-07-27 – 2016-07-28 (×7): 1 mg via INTRAVENOUS
  Filled 2016-07-27 (×8): qty 1

## 2016-07-27 MED ORDER — METHOCARBAMOL 500 MG PO TABS
500.0000 mg | ORAL_TABLET | Freq: Four times a day (QID) | ORAL | Status: DC | PRN
  Administered 2016-07-27 – 2016-07-28 (×3): 500 mg via ORAL
  Filled 2016-07-27 (×3): qty 1

## 2016-07-27 MED ORDER — DEXAMETHASONE SODIUM PHOSPHATE 10 MG/ML IJ SOLN
INTRAMUSCULAR | Status: DC | PRN
  Administered 2016-07-27: 10 mg via INTRAVENOUS

## 2016-07-27 MED ORDER — ONDANSETRON HCL 4 MG/2ML IJ SOLN: 4.0000 mg | Freq: Four times a day (QID) | INTRAMUSCULAR | Status: DC | PRN

## 2016-07-27 MED ORDER — MIDAZOLAM HCL 5 MG/5ML IJ SOLN
INTRAMUSCULAR | Status: DC | PRN
  Administered 2016-07-27: 2 mg via INTRAVENOUS

## 2016-07-27 MED ORDER — HYDROMORPHONE HCL 1 MG/ML IJ SOLN
INTRAMUSCULAR | Status: DC | PRN
  Administered 2016-07-27 (×2): 0.5 mg via INTRAVENOUS
  Administered 2016-07-27 (×2): .5 mg via INTRAVENOUS

## 2016-07-27 MED ORDER — HYDROMORPHONE HCL 1 MG/ML IJ SOLN
INTRAMUSCULAR | Status: AC
  Filled 2016-07-27: qty 2

## 2016-07-27 SURGICAL SUPPLY — 71 items
ANCHOR SUT 1.45 SZ 1 SHORT (Anchor) ×8 IMPLANT
BANDAGE ACE 3X5.8 VEL STRL LF (GAUZE/BANDAGES/DRESSINGS) ×8 IMPLANT
BIT DRILL 2.0 LNG QUCK RELEASE (BIT) ×2 IMPLANT
BIT DRILL 2.8 QR W/DEPTH MARKS (BIT) ×4 IMPLANT
BLADE SURG 15 STRL LF DISP TIS (BLADE) ×8 IMPLANT
BLADE SURG 15 STRL SS (BLADE) ×8
BNDG ESMARK 4X9 LF (GAUZE/BANDAGES/DRESSINGS) ×8 IMPLANT
BNDG GAUZE ELAST 4 BULKY (GAUZE/BANDAGES/DRESSINGS) ×8 IMPLANT
BNDG PLASTER X FAST 3X3 WHT LF (CAST SUPPLIES) ×40 IMPLANT
BONE CHIP PRESERV 5CC PCAN5 (Bone Implant) ×4 IMPLANT
CHLORAPREP W/TINT 26ML (MISCELLANEOUS) ×8 IMPLANT
CORDS BIPOLAR (ELECTRODE) ×8 IMPLANT
COVER BACK TABLE 60X90IN (DRAPES) ×8 IMPLANT
COVER MAYO STAND STRL (DRAPES) ×8 IMPLANT
CUFF TOURNIQUET SINGLE 18IN (TOURNIQUET CUFF) ×8 IMPLANT
CUFF TOURNIQUET SINGLE 24IN (TOURNIQUET CUFF) IMPLANT
DRAPE EXTREMITY T 121X128X90 (DRAPE) ×8 IMPLANT
DRAPE OEC MINIVIEW 54X84 (DRAPES) ×4 IMPLANT
DRAPE SURG 17X23 STRL (DRAPES) ×8 IMPLANT
DRILL 2.0 LNG QUICK RELEASE (BIT) ×4
GAUZE SPONGE 4X4 12PLY STRL (GAUZE/BANDAGES/DRESSINGS) ×4 IMPLANT
GAUZE XEROFORM 1X8 LF (GAUZE/BANDAGES/DRESSINGS) ×8 IMPLANT
GLOVE BIO SURGEON STRL SZ 6.5 (GLOVE) ×3 IMPLANT
GLOVE BIO SURGEON STRL SZ7.5 (GLOVE) ×8 IMPLANT
GLOVE BIO SURGEONS STRL SZ 6.5 (GLOVE) ×1
GLOVE BIOGEL PI IND STRL 7.0 (GLOVE) ×6 IMPLANT
GLOVE BIOGEL PI IND STRL 8 (GLOVE) ×4 IMPLANT
GLOVE BIOGEL PI IND STRL 8.5 (GLOVE) ×4 IMPLANT
GLOVE BIOGEL PI INDICATOR 7.0 (GLOVE) ×6
GLOVE BIOGEL PI INDICATOR 8 (GLOVE) ×4
GLOVE BIOGEL PI INDICATOR 8.5 (GLOVE) ×4
GLOVE SURG ORTHO 8.0 STRL STRW (GLOVE) ×8 IMPLANT
GOWN STRL REUS W/ TWL LRG LVL3 (GOWN DISPOSABLE) ×2 IMPLANT
GOWN STRL REUS W/TWL LRG LVL3 (GOWN DISPOSABLE) ×2
GOWN STRL REUS W/TWL XL LVL3 (GOWN DISPOSABLE) ×16 IMPLANT
NEEDLE HYPO 25X1 1.5 SAFETY (NEEDLE) ×4 IMPLANT
NS IRRIG 1000ML POUR BTL (IV SOLUTION) ×4 IMPLANT
PACK BASIN DAY SURGERY FS (CUSTOM PROCEDURE TRAY) ×4 IMPLANT
PAD CAST 3X4 CTTN HI CHSV (CAST SUPPLIES) ×4 IMPLANT
PADDING CAST ABS 4INX4YD NS (CAST SUPPLIES) ×2
PADDING CAST ABS COTTON 4X4 ST (CAST SUPPLIES) ×2 IMPLANT
PADDING CAST COTTON 3X4 STRL (CAST SUPPLIES) ×4
PLATE WRIST 171 SPAN SHORT HEX (Plate) ×4 IMPLANT
PLATE WRIST SPANNING SHORT (Plate) ×8 IMPLANT
SCREW HEX 3.5X15 NLCKG STRL (Screw) ×2 IMPLANT
SCREW HEX 3.5X15MM (Screw) ×4 IMPLANT
SCREW HEXALOBE NON-LOCK 3.5X14 (Screw) ×16 IMPLANT
SCREW HEXALOBE NON-LOCK 3.5X16 (Screw) ×4 IMPLANT
SCREW NON LOCK 2.7X16 (Screw) ×4 IMPLANT
SCREW NONLOCK 2.7X14 (Screw) ×4 IMPLANT
SCREW NONLOCK HEX 2.7X12 (Screw) ×20 IMPLANT
SLEEVE SCD COMPRESS KNEE MED (MISCELLANEOUS) IMPLANT
STOCKINETTE 4X48 STRL (DRAPES) ×4 IMPLANT
SUCTION FRAZIER HANDLE 10FR (MISCELLANEOUS) ×2
SUCTION TUBE FRAZIER 10FR DISP (MISCELLANEOUS) ×2 IMPLANT
SUT ETHILON 3 0 PS 1 (SUTURE) IMPLANT
SUT ETHILON 4 0 PS 2 18 (SUTURE) ×4 IMPLANT
SUT FIBERWIRE 2-0 18 17.9 3/8 (SUTURE) ×4
SUT VIC AB 2-0 SH 27 (SUTURE)
SUT VIC AB 2-0 SH 27XBRD (SUTURE) IMPLANT
SUT VIC AB 3-0 PS1 18 (SUTURE)
SUT VIC AB 3-0 PS1 18XBRD (SUTURE) IMPLANT
SUT VICRYL 4-0 PS2 18IN ABS (SUTURE) ×4 IMPLANT
SUTURE FIBERWR 2-0 18 17.9 3/8 (SUTURE) ×2 IMPLANT
SYR BULB 3OZ (MISCELLANEOUS) ×4 IMPLANT
SYR CONTROL 10ML LL (SYRINGE) ×4 IMPLANT
TOWEL OR 17X24 6PK STRL BLUE (TOWEL DISPOSABLE) ×8 IMPLANT
TOWEL OR NON WOVEN STRL DISP B (DISPOSABLE) ×8 IMPLANT
TUBE CONNECTING 20'X1/4 (TUBING) ×1
TUBE CONNECTING 20X1/4 (TUBING) ×3 IMPLANT
UNDERPAD 30X30 (UNDERPADS AND DIAPERS) ×8 IMPLANT

## 2016-07-27 NOTE — Op Note (Signed)
I assisted Surgeon(s) and Role:    * Betha LoaKuzma, Kevin, MD - Primary    Cindee Salt* Chaka Jefferys, MD - Assisting on the Procedure(s): OPEN REDUCTION INTERNAL FIXATION (ORIF) DISTAL RADIAL FRACTURE CARPAL TUNNEL RELEASE on 07/27/2016.  I provided assistance on this case as follows: approach debridement, reduction of the fracture application of the spanning plate closure of the wounds on the right.  Approach debridement, reduction of the fracture application of the spanning plate, repair of the ligaments closure of the wounds on the left. I was present for the entire case.  Electronically signed by: Nicki ReaperKUZMA,Sanuel Ladnier R, MD Date: 07/27/2016 Time: 6:39 PM

## 2016-07-27 NOTE — Transfer of Care (Signed)
Immediate Anesthesia Transfer of Care Note  Patient: Daniel BasemanBrandon Medina  Procedure(s) Performed: Procedure(s): OPEN REDUCTION INTERNAL FIXATION (ORIF) DISTAL RADIAL FRACTURE (Bilateral) CARPAL TUNNEL RELEASE (Left)  Patient Location: PACU  Anesthesia Type:General  Level of Consciousness: sedated  Airway & Oxygen Therapy: Patient Spontanous Breathing and Patient connected to face mask oxygen  Post-op Assessment: Report given to RN and Post -op Vital signs reviewed and stable  Post vital signs: Reviewed and stable  Last Vitals:  Vitals:   07/27/16 1840  BP: (!) 170/94    Last Pain: There were no vitals filed for this visit.       Complications: No apparent anesthesia complications

## 2016-07-27 NOTE — Progress Notes (Signed)
Orthopaedic Trauma Service (OTS)  6 Days Post-Op Procedure(s) (LRB): OPEN REDUCTION INTERNAL FIXATION (ORIF) BILATERAL FEET  FRACTURE (Bilateral)  Subjective: Patient reports pain as mild.    Objective: Temp:  [98.3 F (36.8 C)-98.4 F (36.9 C)] 98.3 F (36.8 C) (05/17 0500) Pulse Rate:  [82-88] 82 (05/17 0500) Resp:  [16] 16 (05/17 0500) BP: (131-134)/(67-79) 131/79 (05/17 0500) SpO2:  [96 %-98 %] 96 % (05/17 0500) Weight:  [108.9 kg (240 lb)] 108.9 kg (240 lb) (05/16 0940)  Physical Exam R&LLE Dressings intact, clean, dry  Edema/ swelling controlled  Sens: DPN, SPN, TN intact  Motor: Great and lessor toe ext and flex all intact grossly  Brisk cap refill, warm to touch  Assessment/Plan: 6 Days Post-Op Procedure(s) (LRB): OPEN REDUCTION INTERNAL FIXATION (ORIF) BILATERAL FEET  FRACTURE (Bilateral)  1. F/u in 1-2 weeks; NWB Bilat; call if questions 2. DVT proph 3. bilat wrist surgery today  Myrene GalasMichael Gottlieb Zuercher, MD [facility] [phone] (p)

## 2016-07-27 NOTE — Discharge Instructions (Signed)

## 2016-07-27 NOTE — Progress Notes (Signed)
Discharge instructions printed and reviewed with patient and father, and copy given for them to take home. All questions addressed at this time. New prescriptions given to patient and reviewed. IV left in place for use at day surgery. Family packed patient belongings at their request, and staff confirmed with patient that all valuables were accounted for. [facility] transported patient to the day surgery center for his hand surgery. The patient will then be discharged home.

## 2016-07-27 NOTE — Anesthesia Preprocedure Evaluation (Addendum)
Anesthesia Evaluation  Patient identified by MRN, date of birth, ID band Patient awake    Reviewed: Allergy & Precautions, H&P , NPO status , Patient's Chart, lab work & pertinent test results  Airway Mallampati: II  TM Distance: >3 FB Neck ROM: Full    Dental no notable dental hx. (+) Teeth Intact, Dental Advisory Given   Pulmonary Current Smoker,    Pulmonary exam normal breath sounds clear to auscultation       Cardiovascular negative cardio ROS   Rhythm:Regular Rate:Normal     Neuro/Psych negative neurological ROS  negative psych ROS   GI/Hepatic negative GI ROS, Neg liver ROS,   Endo/Other  negative endocrine ROS  Renal/GU negative Renal ROS  negative genitourinary   Musculoskeletal   Abdominal   Peds  Hematology negative hematology ROS (+)   Anesthesia Other Findings   Reproductive/Obstetrics negative OB ROS                            Anesthesia Physical Anesthesia Plan  ASA: II  Anesthesia Plan: General   Post-op Pain Management:    Induction: Intravenous  Airway Management Planned: LMA  Additional Equipment:   Intra-op Plan:   Post-operative Plan: Extubation in OR  Informed Consent: I have reviewed the patients History and Physical, chart, labs and discussed the procedure including the risks, benefits and alternatives for the proposed anesthesia with the patient or authorized representative who has indicated his/her understanding and acceptance.   Dental advisory given  Plan Discussed with: CRNA  Anesthesia Plan Comments:         Anesthesia Quick Evaluation  

## 2016-07-27 NOTE — OR Nursing (Signed)
Sent word by [facility] nurse to family that patient is stable and procedure is progressing as planned.

## 2016-07-27 NOTE — Anesthesia Postprocedure Evaluation (Addendum)
Anesthesia Post Note  Patient: Daniel Medina  Procedure(s) Performed: Procedure(s) (LRB): OPEN REDUCTION INTERNAL FIXATION (ORIF) DISTAL RADIAL FRACTURE (Bilateral) CARPAL TUNNEL RELEASE (Left)  Patient location during evaluation: PACU Anesthesia Type: General Level of consciousness: awake and alert Vital Signs Assessment: post-procedure vital signs reviewed and stable Respiratory status: spontaneous breathing, nonlabored ventilation, respiratory function stable and patient connected to nasal cannula oxygen Cardiovascular status: blood pressure returned to baseline and stable Postop Assessment: no signs of nausea or vomiting Anesthetic complications: no Comments: Will keep overnight due to uncontrolled pain despite IV pain meds. I do not think sending patient with PO pain meds is optimal for the patient.        Last Vitals:  Vitals:   07/27/16 1930 07/27/16 1945  BP: (!) 165/102 (!) 159/109  Pulse: (!) 112 (!) 104  Resp: 20 16  Temp:      Last Pain:  Vitals:   07/27/16 1945  PainSc: 10-Worst pain ever                 Shelton SilvasKevin D Laporshia Hogen

## 2016-07-27 NOTE — Op Note (Signed)
[id number] 

## 2016-07-27 NOTE — Anesthesia Procedure Notes (Signed)
Procedure Name: LMA Insertion Date/Time: 07/27/2016 2:55 PM Performed by: Zenia ResidesPAYNE, Delio Slates D Pre-anesthesia Checklist: Patient identified, Emergency Drugs available, Suction available and Patient being monitored Patient Re-evaluated:Patient Re-evaluated prior to inductionOxygen Delivery Method: Circle system utilized Preoxygenation: Pre-oxygenation with 100% oxygen Intubation Type: IV induction Ventilation: Mask ventilation without difficulty LMA: LMA inserted LMA Size: 4.0 Number of attempts: 1 Airway Equipment and Method: Bite block Placement Confirmation: positive ETCO2 Tube secured with: Tape Dental Injury: Teeth and Oropharynx as per pre-operative assessment

## 2016-07-27 NOTE — Brief Op Note (Signed)
07/27/2016  6:31 PM  PATIENT:  Daniel Medina  26 y.o. male  PRE-OPERATIVE DIAGNOSIS:  BILATERAL DISTAL RADIUS FRACTURES, COMPRESSSION MEDIAN NERVE LEFT  POST-OPERATIVE DIAGNOSIS:  FRACTURES COMPRESSSION MEDIAN NERVE LEFT, COMPRESSSION MEDIAN NERVE LEFT  PROCEDURE:  Procedure(s): OPEN REDUCTION INTERNAL FIXATION (ORIF) DISTAL RADIAL FRACTURE (Bilateral) CARPAL TUNNEL RELEASE (Left)  SURGEON:  Surgeon(s) and Role:    * Betha LoaKuzma, Sharod Petsch, MD - Primary    * Cindee SaltKuzma, Gary, MD - Assisting  PHYSICIAN ASSISTANT:   ASSISTANTS: Cindee SaltGary Junius Faucett, MD   ANESTHESIA:   general  EBL:  No intake/output data recorded.  BLOOD ADMINISTERED:none  DRAINS: none   LOCAL MEDICATIONS USED:  MARCAINE     SPECIMEN:  No Specimen  DISPOSITION OF SPECIMEN:  N/A  COUNTS:  YES  TOURNIQUET:   Total Tourniquet Time Documented: Upper Arm (Right) - 78 minutes Total: Upper Arm (Right) - 78 minutes  Upper Arm (Left) - 106 minutes Total: Upper Arm (Left) - 106 minutes   DICTATION: .Other Dictation: Dictation Number [id number]  PLAN OF CARE: Discharge to home after PACU  PATIENT DISPOSITION:  PACU - hemodynamically stable.

## 2016-07-27 NOTE — Progress Notes (Signed)
OT Cancellation Note  Patient Details Name: Dorothey BasemanBrandon Haigh MRN: [id number] DOB: 10-13-1990   Cancelled Treatment:    Reason Eval/Treat Not Completed: Medical issues which prohibited therapy;Other (comment). Pt preparing to go to OR for wrist surgery  Galen ManilaSpencer, Kimmi Acocella Jeanette 07/27/2016, 11:41 AM

## 2016-07-27 NOTE — Progress Notes (Signed)
Pt for [facility] today for bilateral hand surgery.  Printed [facility] transport packet and medical necessity form.  Confirmed with [facility] all HH services in place and DME to be delivered today to pt's home between 12-3pm.    Quintella BatonJulie W. Najee Manninen, RN, BSN  Trauma/Neuro ICU Case Manager [phone]
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
## 2016-07-28 ENCOUNTER — Encounter (HOSPITAL_BASED_OUTPATIENT_CLINIC_OR_DEPARTMENT_OTHER): Payer: Self-pay | Admitting: Orthopedic Surgery

## 2016-07-28 DIAGNOSIS — G5602 Carpal tunnel syndrome, left upper limb: Secondary | ICD-10-CM | POA: Diagnosis not present

## 2016-07-28 NOTE — Progress Notes (Signed)
Patient states pain in left arm 10/10, right arm 8/10, right leg 6/10, left leg 6/10. Plan is to [facility]. I had conversation with patient about his pain being controlled at home. He feels he can control his pain at home and wants to go home. I offered the option of transfer to the hospital for continued pain control, pt refused and wants to continue with plan to go home.  Discharge instructions understood by patient and pt's father, Judie GrieveBryan.
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
## 2016-07-28 NOTE — Addendum Note (Signed)
Addendum  created 07/28/16 0650 by Ronnette HilaPayne, Zyliah Schier D, CRNA   Charge Capture section accepted

## 2016-07-29 LAB — MRSA CULTURE: Culture: NOT DETECTED

## 2016-08-02 ENCOUNTER — Telehealth (HOSPITAL_COMMUNITY): Payer: Self-pay

## 2016-08-02 ENCOUNTER — Other Ambulatory Visit (INDEPENDENT_AMBULATORY_CARE_PROVIDER_SITE_OTHER): Payer: Self-pay | Admitting: General Surgery

## 2016-08-02 ENCOUNTER — Other Ambulatory Visit (INDEPENDENT_AMBULATORY_CARE_PROVIDER_SITE_OTHER): Payer: Self-pay | Admitting: Physician Assistant

## 2016-08-02 NOTE — Telephone Encounter (Signed)
[facility]. Recommend calling one of the two physicians that the patient is following up with for new PT referral. She [place] that [place] [facility] [place] [facility]. No further needs at this time.

## 2016-08-02 NOTE — Telephone Encounter (Signed)
Please Engineer, drillingcall Amber - Physical Therapist [phone]. Did exam today and need orders to continue working with Daniel Medina

## 2016-08-11 NOTE — Addendum Note (Signed)
Addendum  created 08/11/16 1258 by Shelton SilvasHollis, Coley Littles D, MD   Sign clinical note

## 2016-08-18 NOTE — Addendum Note (Signed)
Addendum  created 08/18/16 1233 by Tearah Saulsbury, MD   Sign clinical note    

## 2016-10-20 ENCOUNTER — Other Ambulatory Visit: Payer: Self-pay | Admitting: Orthopedic Surgery

## 2016-10-20 DIAGNOSIS — S52572D Other intraarticular fracture of lower end of left radius, subsequent encounter for closed fracture with routine healing: Secondary | ICD-10-CM

## 2016-10-20 DIAGNOSIS — S52571D Other intraarticular fracture of lower end of right radius, subsequent encounter for closed fracture with routine healing: Secondary | ICD-10-CM

## 2016-11-10 ENCOUNTER — Ambulatory Visit
Admission: RE | Admit: 2016-11-10 | Discharge: 2016-11-10 | Disposition: A | Discharge status: Home / Self Care | Payer: 59 | Source: Ambulatory Visit | Attending: Orthopedic Surgery | Admitting: Orthopedic Surgery

## 2016-11-10 DIAGNOSIS — S52572D Other intraarticular fracture of lower end of left radius, subsequent encounter for closed fracture with routine healing: Secondary | ICD-10-CM

## 2016-11-10 DIAGNOSIS — S52571D Other intraarticular fracture of lower end of right radius, subsequent encounter for closed fracture with routine healing: Secondary | ICD-10-CM

## 2016-12-18 ENCOUNTER — Other Ambulatory Visit: Payer: Self-pay | Admitting: Orthopedic Surgery

## 2017-01-08 NOTE — Pre-Procedure Instructions (Signed)
Daniel BasemanBrandon Arthurs  01/08/2017      [id number], [place] - [phone] [facility] [address] [place] [address] Phone: [phone] Fax: [phone]    Your procedure is scheduled on November 1  [facility]  Call this number if you have problems the morning of surgery:  [phone]   Remember:  Do not eat food or drink liquids after midnight.  Continue all other medications as directed by your physician except follow these medication instructions before surgery   Take these medicines the morning of surgery with A SIP OF WATER  Cetirizine HCl (ZYRTEC ALLERGY)  methocarbamol (ROBAXIN) oxyCODONE-acetaminophen (PERCOCET)  7 days prior to surgery STOP taking any Aspirin (unless otherwise instructed by your surgeon), Aleve, Naproxen, Ibuprofen, Motrin, Advil, Goody's, BC's, all herbal medications, fish oil, and all vitamins  FOLLOW PHYSICIANS INSTRUCTIONS ABOUT LOVENOX   Do not wear jewelry  Do not wear lotions, powders, or cologne, or deoderant.  Men may shave face and neck.  Do not bring valuables to the hospital.  [facility] is not responsible for any belongings or valuables.  Contacts, dentures or bridgework may not be worn into surgery.  Leave your suitcase in the car.  After surgery it may be brought to your room.  For patients admitted to the hospital, discharge time will be determined by your treatment team.  Patients discharged the day of surgery will not be allowed to drive home.    Special instructions:   [place]- Preparing For Surgery  Before surgery, you can play an important role. Because skin is not sterile, your skin needs to be as free of germs as possible. You can reduce the number of germs on your skin by washing with CHG (chlorahexidine gluconate) Soap before surgery.  CHG is an antiseptic cleaner which kills germs and bonds with the skin to  continue killing germs even after washing.  Please do not use if you have an allergy to CHG or antibacterial soaps. If your skin becomes reddened/irritated stop using the CHG.  Do not shave (including legs and underarms) for at least 48 hours prior to first CHG shower. It is OK to shave your face.  Please follow these instructions carefully.   1. Shower the NIGHT BEFORE SURGERY and the MORNING OF SURGERY with CHG.   2. If you chose to wash your hair, wash your hair first as usual with your normal shampoo.  3. After you shampoo, rinse your hair and body thoroughly to remove the shampoo.  4. Use CHG as you would any other liquid soap. You can apply CHG directly to the skin and wash gently with a scrungie or a clean washcloth.   5. Apply the CHG Soap to your body ONLY FROM THE NECK DOWN.  Do not use on open wounds or open sores. Avoid contact with your eyes, ears, mouth and genitals (private parts). Wash Face and genitals (private parts)  with your normal soap.  6. Wash thoroughly, paying special attention to the area where your surgery will be performed.  7. Thoroughly rinse your body with warm water from the neck down.  8. DO NOT shower/wash with your normal soap after using and rinsing off the CHG Soap.  9. Pat yourself dry with a CLEAN TOWEL.  10. Wear CLEAN PAJAMAS to bed the night before surgery, wear comfortable clothes the morning of surgery  11. Place CLEAN SHEETS  on your bed the night of your first shower and DO NOT SLEEP WITH PETS.    Day of Surgery: Do not apply any deodorants/lotions. Please wear clean clothes to the hospital/surgery center.      Please read over the following fact sheets that you were given.

## 2017-01-09 ENCOUNTER — Encounter (HOSPITAL_COMMUNITY)
Admission: RE | Admit: 2017-01-09 | Discharge: 2017-01-09 | Disposition: A | Discharge status: Home / Self Care | Payer: 59 | Source: Ambulatory Visit | Attending: Orthopedic Surgery | Admitting: Orthopedic Surgery

## 2017-01-09 ENCOUNTER — Encounter (HOSPITAL_COMMUNITY): Payer: Self-pay

## 2017-01-09 DIAGNOSIS — Z91018 Allergy to other foods: Secondary | ICD-10-CM | POA: Diagnosis not present

## 2017-01-09 DIAGNOSIS — Z87891 Personal history of nicotine dependence: Secondary | ICD-10-CM | POA: Diagnosis not present

## 2017-01-09 DIAGNOSIS — T8489XA Other specified complication of internal orthopedic prosthetic devices, implants and grafts, initial encounter: Secondary | ICD-10-CM | POA: Diagnosis present

## 2017-01-09 DIAGNOSIS — Y793 Surgical instruments, materials and orthopedic devices (including sutures) associated with adverse incidents: Secondary | ICD-10-CM | POA: Diagnosis not present

## 2017-01-09 DIAGNOSIS — S52591D Other fractures of lower end of right radius, subsequent encounter for closed fracture with routine healing: Secondary | ICD-10-CM | POA: Diagnosis not present

## 2017-01-09 DIAGNOSIS — S52592D Other fractures of lower end of left radius, subsequent encounter for closed fracture with routine healing: Secondary | ICD-10-CM | POA: Diagnosis not present

## 2017-01-09 LAB — CBC
HCT: 46.1 % (ref 39.0–52.0)
Hemoglobin: 15.8 g/dL (ref 13.0–17.0)
MCH: 29.9 pg (ref 26.0–34.0)
MCHC: 34.3 g/dL (ref 30.0–36.0)
MCV: 87.1 fL (ref 78.0–100.0)
PLATELETS: 224 10*3/uL (ref 150–400)
RBC: 5.29 MIL/uL (ref 4.22–5.81)
RDW: 13.2 % (ref 11.5–15.5)
WBC: 10.4 10*3/uL (ref 4.0–10.5)

## 2017-01-09 NOTE — Progress Notes (Signed)
Not here for PAT appointment called states he is pulling into the parking deck now.

## 2017-01-09 NOTE — Progress Notes (Signed)
Denies having a PCP or cardiologist.  Denies  chest pain, fever, or cough. Denies ever having a stress test, echo, or card cath.

## 2017-01-09 NOTE — Pre-Procedure Instructions (Signed)
Casandra DoffingBrandon R Krider  01/09/2017      [id number] [address] [address],  - [phone] AT [facility] [address] [place] [address] Phone: [phone] Fax: [phone]    Your procedure is scheduled on November 1  [facility]  Call this number if you have problems the morning of surgery:  [phone]   Remember:  Do not eat food or drink liquids after midnight.  Continue all other medications as directed by your physician except follow these medication instructions before surgery   Take these medicines the morning of surgery with A SIP OF WATER na     7 days prior to surgery STOP taking any Aspirin (unless otherwise instructed by your surgeon), Aleve, Naproxen, Ibuprofen, Motrin, Advil, Goody's, BC's, all herbal medications, fish oil, and all vitamins     Do not wear jewelry  Do not wear lotions, powders, or cologne, or deoderant.  Men may shave face and neck.  Do not bring valuables to the hospital.  [facility] is not responsible for any belongings or valuables.  Contacts, dentures or bridgework may not be worn into surgery.  Leave your suitcase in the car.  After surgery it may be brought to your room.  For patients admitted to the hospital, discharge time will be determined by your treatment team.  Patients discharged the day of surgery will not be allowed to drive home.    Special instructions:   [place]- Preparing For Surgery  Before surgery, you can play an important role. Because skin is not sterile, your skin needs to be as free of germs as possible. You can reduce the number of germs on your skin by washing with CHG (chlorahexidine gluconate) Soap before surgery.  CHG is an antiseptic cleaner which kills germs and bonds with the skin to continue killing germs even after washing.  Please do not use if you have an allergy to CHG or antibacterial soaps. If your skin  becomes reddened/irritated stop using the CHG.  Do not shave (including legs and underarms) for at least 48 hours prior to first CHG shower. It is OK to shave your face.  Please follow these instructions carefully.   1. Shower the NIGHT BEFORE SURGERY and the MORNING OF SURGERY with CHG.   2. If you chose to wash your hair, wash your hair first as usual with your normal shampoo.  3. After you shampoo, rinse your hair and body thoroughly to remove the shampoo.  4. Use CHG as you would any other liquid soap. You can apply CHG directly to the skin and wash gently with a scrungie or a clean washcloth.   5. Apply the CHG Soap to your body ONLY FROM THE NECK DOWN.  Do not use on open wounds or open sores. Avoid contact with your eyes, ears, mouth and genitals (private parts). Wash Face and genitals (private parts)  with your normal soap.  6. Wash thoroughly, paying special attention to the area where your surgery will be performed.  7. Thoroughly rinse your body with warm water from the neck down.  8. DO NOT shower/wash with your normal soap after using and rinsing off the CHG Soap.  9. Pat yourself dry with a CLEAN TOWEL.  10. Wear CLEAN PAJAMAS to bed the night before surgery, wear comfortable clothes the morning of surgery  11. Place CLEAN SHEETS on your bed the night of your first shower  and DO NOT SLEEP WITH PETS.    Day of Surgery: Do not apply any deodorants/lotions. Please wear clean clothes to the hospital/surgery center.      Please read over the following fact sheets that you were given.

## 2017-01-11 ENCOUNTER — Ambulatory Visit: Admit: 2017-01-11 | Payer: 59 | Admitting: Orthopedic Surgery

## 2017-01-11 ENCOUNTER — Ambulatory Visit (HOSPITAL_COMMUNITY): Payer: 59

## 2017-01-11 ENCOUNTER — Ambulatory Visit (HOSPITAL_COMMUNITY): Payer: 59 | Admitting: Anesthesiology

## 2017-01-11 ENCOUNTER — Encounter (HOSPITAL_COMMUNITY): Payer: Self-pay | Admitting: *Deleted

## 2017-01-11 ENCOUNTER — Ambulatory Visit (HOSPITAL_COMMUNITY)
Admission: RE | Admit: 2017-01-11 | Discharge: 2017-01-11 | Disposition: A | Discharge status: Home / Self Care | Payer: 59 | Source: Ambulatory Visit | Attending: Orthopedic Surgery | Admitting: Orthopedic Surgery

## 2017-01-11 ENCOUNTER — Encounter (HOSPITAL_COMMUNITY)
Admission: RE | Disposition: A | Discharge status: Home / Self Care | Payer: Self-pay | Source: Ambulatory Visit | Attending: Orthopedic Surgery

## 2017-01-11 DIAGNOSIS — S52591D Other fractures of lower end of right radius, subsequent encounter for closed fracture with routine healing: Secondary | ICD-10-CM | POA: Insufficient documentation

## 2017-01-11 DIAGNOSIS — S52592D Other fractures of lower end of left radius, subsequent encounter for closed fracture with routine healing: Secondary | ICD-10-CM | POA: Insufficient documentation

## 2017-01-11 DIAGNOSIS — Z419 Encounter for procedure for purposes other than remedying health state, unspecified: Secondary | ICD-10-CM

## 2017-01-11 DIAGNOSIS — Y793 Surgical instruments, materials and orthopedic devices (including sutures) associated with adverse incidents: Secondary | ICD-10-CM | POA: Insufficient documentation

## 2017-01-11 DIAGNOSIS — Z91018 Allergy to other foods: Secondary | ICD-10-CM | POA: Insufficient documentation

## 2017-01-11 DIAGNOSIS — T8489XA Other specified complication of internal orthopedic prosthetic devices, implants and grafts, initial encounter: Secondary | ICD-10-CM | POA: Insufficient documentation

## 2017-01-11 DIAGNOSIS — Z87891 Personal history of nicotine dependence: Secondary | ICD-10-CM | POA: Insufficient documentation

## 2017-01-11 DIAGNOSIS — T8484XA Pain due to internal orthopedic prosthetic devices, implants and grafts, initial encounter: Secondary | ICD-10-CM

## 2017-01-11 HISTORY — DX: Unspecified fracture of unspecified thoracic vertebra, initial encounter for closed fracture: S22.009A

## 2017-01-11 HISTORY — PX: HARDWARE REMOVAL: SHX979

## 2017-01-11 SURGERY — REMOVAL, HARDWARE
Anesthesia: Choice | Laterality: Bilateral

## 2017-01-11 SURGERY — REMOVAL, HARDWARE
Anesthesia: General | Site: Foot | Laterality: Right

## 2017-01-11 MED ORDER — LIDOCAINE HCL (CARDIAC) 20 MG/ML IV SOLN
INTRAVENOUS | Status: DC | PRN
  Administered 2017-01-11: 100 mg via INTRATRACHEAL

## 2017-01-11 MED ORDER — KETOROLAC TROMETHAMINE 10 MG PO TABS: 10.0000 mg | ORAL_TABLET | Freq: Four times a day (QID) | ORAL | 0 refills | Status: AC | PRN

## 2017-01-11 MED ORDER — DEXAMETHASONE SODIUM PHOSPHATE 10 MG/ML IJ SOLN
INTRAMUSCULAR | Status: DC | PRN
  Administered 2017-01-11: 5 mg via INTRAVENOUS

## 2017-01-11 MED ORDER — FENTANYL CITRATE (PF) 100 MCG/2ML IJ SOLN: 25.0000 ug | INTRAMUSCULAR | Status: DC | PRN

## 2017-01-11 MED ORDER — HYDROMORPHONE HCL 1 MG/ML IJ SOLN
0.2500 mg | INTRAMUSCULAR | Status: DC | PRN
  Administered 2017-01-11 (×2): 0.5 mg via INTRAVENOUS

## 2017-01-11 MED ORDER — CHLORHEXIDINE GLUCONATE 4 % EX LIQD: 60.0000 mL | Freq: Once | CUTANEOUS | Status: DC

## 2017-01-11 MED ORDER — OXYCODONE HCL 5 MG/5ML PO SOLN: 5.0000 mg | Freq: Once | ORAL | Status: DC | PRN

## 2017-01-11 MED ORDER — KETOROLAC TROMETHAMINE 30 MG/ML IJ SOLN
30.0000 mg | Freq: Once | INTRAMUSCULAR | Status: AC
  Administered 2017-01-11: 30 mg via INTRAVENOUS

## 2017-01-11 MED ORDER — FENTANYL CITRATE (PF) 250 MCG/5ML IJ SOLN
INTRAMUSCULAR | Status: DC | PRN
  Administered 2017-01-11: 100 ug via INTRAVENOUS
  Administered 2017-01-11 (×2): 150 ug via INTRAVENOUS

## 2017-01-11 MED ORDER — CEFAZOLIN SODIUM-DEXTROSE 2-4 GM/100ML-% IV SOLN
2.0000 g | INTRAVENOUS | Status: AC
  Administered 2017-01-11: 2 g via INTRAVENOUS
  Filled 2017-01-11: qty 100

## 2017-01-11 MED ORDER — MIDAZOLAM HCL 5 MG/5ML IJ SOLN
INTRAMUSCULAR | Status: DC | PRN
  Administered 2017-01-11: 2 mg via INTRAVENOUS

## 2017-01-11 MED ORDER — 0.9 % SODIUM CHLORIDE (POUR BTL) OPTIME
TOPICAL | Status: DC | PRN
  Administered 2017-01-11: 1000 mL

## 2017-01-11 MED ORDER — PROPOFOL 10 MG/ML IV BOLUS
INTRAVENOUS | Status: AC
  Filled 2017-01-11: qty 20

## 2017-01-11 MED ORDER — TRAMADOL HCL 50 MG PO TABS: 50.0000 mg | ORAL_TABLET | Freq: Four times a day (QID) | ORAL | 0 refills | Status: AC | PRN

## 2017-01-11 MED ORDER — SUGAMMADEX SODIUM 200 MG/2ML IV SOLN
INTRAVENOUS | Status: DC | PRN
  Administered 2017-01-11: 200 mg via INTRAVENOUS

## 2017-01-11 MED ORDER — OXYCODONE HCL 5 MG PO TABS: 5.0000 mg | ORAL_TABLET | Freq: Once | ORAL | Status: DC | PRN

## 2017-01-11 MED ORDER — ONDANSETRON HCL 4 MG/2ML IJ SOLN
INTRAMUSCULAR | Status: DC | PRN
  Administered 2017-01-11: 4 mg via INTRAVENOUS

## 2017-01-11 MED ORDER — MIDAZOLAM HCL 2 MG/2ML IJ SOLN
INTRAMUSCULAR | Status: AC
  Filled 2017-01-11: qty 2

## 2017-01-11 MED ORDER — ONDANSETRON HCL 4 MG/2ML IJ SOLN
4.0000 mg | Freq: Once | INTRAMUSCULAR | Status: DC | PRN
Start: 2017-01-11 — End: 2017-01-11

## 2017-01-11 MED ORDER — KETOROLAC TROMETHAMINE 30 MG/ML IJ SOLN
INTRAMUSCULAR | Status: AC
  Filled 2017-01-11: qty 1

## 2017-01-11 MED ORDER — FENTANYL CITRATE (PF) 250 MCG/5ML IJ SOLN
INTRAMUSCULAR | Status: AC
  Filled 2017-01-11: qty 10

## 2017-01-11 MED ORDER — BUPIVACAINE HCL (PF) 0.25 % IJ SOLN
INTRAMUSCULAR | Status: AC
  Filled 2017-01-11: qty 30

## 2017-01-11 MED ORDER — BUPIVACAINE HCL (PF) 0.25 % IJ SOLN
INTRAMUSCULAR | Status: DC | PRN
  Administered 2017-01-11: 30 mL

## 2017-01-11 MED ORDER — ONDANSETRON 4 MG PO TBDP: 4.0000 mg | ORAL_TABLET | Freq: Three times a day (TID) | ORAL | 0 refills | Status: AC | PRN

## 2017-01-11 MED ORDER — HYDROMORPHONE HCL 1 MG/ML IJ SOLN
INTRAMUSCULAR | Status: AC
  Filled 2017-01-11: qty 1

## 2017-01-11 MED ORDER — LACTATED RINGERS IV SOLN
INTRAVENOUS | Status: DC
  Administered 2017-01-11 (×2): via INTRAVENOUS

## 2017-01-11 MED ORDER — ROCURONIUM BROMIDE 100 MG/10ML IV SOLN
INTRAVENOUS | Status: DC | PRN
  Administered 2017-01-11: 50 mg via INTRAVENOUS
  Administered 2017-01-11: 20 mg via INTRAVENOUS

## 2017-01-11 MED ORDER — PROPOFOL 10 MG/ML IV BOLUS
INTRAVENOUS | Status: DC | PRN
  Administered 2017-01-11: 200 mg via INTRAVENOUS

## 2017-01-11 SURGICAL SUPPLY — 79 items
BANDAGE ACE 3X5.8 VEL STRL LF (GAUZE/BANDAGES/DRESSINGS) ×8 IMPLANT
BANDAGE ACE 4X5 VEL STRL LF (GAUZE/BANDAGES/DRESSINGS) ×8 IMPLANT
BANDAGE ACE 6X5 VEL STRL LF (GAUZE/BANDAGES/DRESSINGS) ×4 IMPLANT
BANDAGE ELASTIC 6 VELCRO ST LF (GAUZE/BANDAGES/DRESSINGS) ×4 IMPLANT
BANDAGE ESMARK 6X9 LF (GAUZE/BANDAGES/DRESSINGS) ×2 IMPLANT
BNDG COHESIVE 6X5 TAN STRL LF (GAUZE/BANDAGES/DRESSINGS) ×4 IMPLANT
BNDG ESMARK 4X9 LF (GAUZE/BANDAGES/DRESSINGS) ×4 IMPLANT
BNDG ESMARK 6X9 LF (GAUZE/BANDAGES/DRESSINGS) ×4
BNDG GAUZE ELAST 4 BULKY (GAUZE/BANDAGES/DRESSINGS) ×12 IMPLANT
BRUSH SCRUB SURG 4.25 DISP (MISCELLANEOUS) ×8 IMPLANT
CLOSURE WOUND 1/2 X4 (GAUZE/BANDAGES/DRESSINGS)
CORDS BIPOLAR (ELECTRODE) ×4 IMPLANT
COVER SURGICAL LIGHT HANDLE (MISCELLANEOUS) ×12 IMPLANT
CUFF TOURNIQUET SINGLE 18IN (TOURNIQUET CUFF) ×4 IMPLANT
CUFF TOURNIQUET SINGLE 24IN (TOURNIQUET CUFF) IMPLANT
CUFF TOURNIQUET SINGLE 34IN LL (TOURNIQUET CUFF) IMPLANT
DRAPE C-ARM 42X72 X-RAY (DRAPES) ×8 IMPLANT
DRAPE C-ARMOR (DRAPES) ×4 IMPLANT
DRAPE EXTREMITY T 121X128X90 (DRAPE) ×8 IMPLANT
DRAPE SURG 17X23 STRL (DRAPES) ×4 IMPLANT
DRAPE U-SHAPE 47X51 STRL (DRAPES) ×4 IMPLANT
DRSG ADAPTIC 3X8 NADH LF (GAUZE/BANDAGES/DRESSINGS) ×4 IMPLANT
DRSG PAD ABDOMINAL 8X10 ST (GAUZE/BANDAGES/DRESSINGS) ×8 IMPLANT
DURAPREP 26ML APPLICATOR (WOUND CARE) ×4 IMPLANT
ELECT REM PT RETURN 9FT ADLT (ELECTROSURGICAL) ×4
ELECTRODE REM PT RTRN 9FT ADLT (ELECTROSURGICAL) ×2 IMPLANT
GAUZE SPONGE 4X4 12PLY STRL (GAUZE/BANDAGES/DRESSINGS) ×12 IMPLANT
GAUZE XEROFORM 1X8 LF (GAUZE/BANDAGES/DRESSINGS) ×12 IMPLANT
GLOVE BIO SURGEON STRL SZ7.5 (GLOVE) ×12 IMPLANT
GLOVE BIO SURGEON STRL SZ8 (GLOVE) ×4 IMPLANT
GLOVE BIOGEL PI IND STRL 7.5 (GLOVE) ×2 IMPLANT
GLOVE BIOGEL PI IND STRL 8 (GLOVE) ×4 IMPLANT
GLOVE BIOGEL PI INDICATOR 7.5 (GLOVE) ×2
GLOVE BIOGEL PI INDICATOR 8 (GLOVE) ×4
GOWN STRL REUS W/ TWL LRG LVL3 (GOWN DISPOSABLE) ×8 IMPLANT
GOWN STRL REUS W/ TWL XL LVL3 (GOWN DISPOSABLE) ×2 IMPLANT
GOWN STRL REUS W/TWL LRG LVL3 (GOWN DISPOSABLE) ×8
GOWN STRL REUS W/TWL XL LVL3 (GOWN DISPOSABLE) ×2
KIT BASIN OR (CUSTOM PROCEDURE TRAY) ×8 IMPLANT
KIT ROOM TURNOVER OR (KITS) ×8 IMPLANT
MANIFOLD NEPTUNE II (INSTRUMENTS) ×4 IMPLANT
NEEDLE 22X1 1/2 (OR ONLY) (NEEDLE) IMPLANT
NEEDLE HYPO 25GX1X1/2 BEV (NEEDLE) IMPLANT
NS IRRIG 1000ML POUR BTL (IV SOLUTION) ×8 IMPLANT
PACK ORTHO EXTREMITY (CUSTOM PROCEDURE TRAY) ×12 IMPLANT
PAD ARMBOARD 7.5X6 YLW CONV (MISCELLANEOUS) ×16 IMPLANT
PAD CAST 3X4 CTTN HI CHSV (CAST SUPPLIES) ×4 IMPLANT
PAD CAST 4YDX4 CTTN HI CHSV (CAST SUPPLIES) ×6 IMPLANT
PADDING CAST ABS 4INX4YD NS (CAST SUPPLIES) ×2
PADDING CAST ABS COTTON 4X4 ST (CAST SUPPLIES) ×2 IMPLANT
PADDING CAST COTTON 3X4 STRL (CAST SUPPLIES) ×4
PADDING CAST COTTON 4X4 STRL (CAST SUPPLIES) ×6
PADDING CAST COTTON 6X4 STRL (CAST SUPPLIES) ×4 IMPLANT
SPLINT PLASTER EXTRA FAST 3X15 (CAST SUPPLIES) ×4
SPLINT PLASTER GYPS XFAST 3X15 (CAST SUPPLIES) ×4 IMPLANT
SPONGE LAP 18X18 X RAY DECT (DISPOSABLE) ×4 IMPLANT
SPONGE LAP 4X18 X RAY DECT (DISPOSABLE) ×4 IMPLANT
STAPLER VISISTAT 35W (STAPLE) IMPLANT
STOCKINETTE IMPERVIOUS LG (DRAPES) ×4 IMPLANT
STRIP CLOSURE SKIN 1/2X4 (GAUZE/BANDAGES/DRESSINGS) IMPLANT
SUCTION FRAZIER HANDLE 10FR (MISCELLANEOUS)
SUCTION TUBE FRAZIER 10FR DISP (MISCELLANEOUS) IMPLANT
SUT ETHILON 3 0 PS 1 (SUTURE) ×4 IMPLANT
SUT ETHILON 4 0 PS 2 18 (SUTURE) ×8 IMPLANT
SUT PDS AB 2-0 CT1 27 (SUTURE) IMPLANT
SUT VIC AB 0 CT1 27 (SUTURE)
SUT VIC AB 0 CT1 27XBRD ANBCTR (SUTURE) IMPLANT
SUT VIC AB 2-0 CT1 27 (SUTURE)
SUT VIC AB 2-0 CT1 TAPERPNT 27 (SUTURE) IMPLANT
SUT VIC AB 3-0 SH 18 (SUTURE) ×4 IMPLANT
SUT VIC AB 4-0 PS2 27 (SUTURE) ×4 IMPLANT
SYR CONTROL 10ML LL (SYRINGE) IMPLANT
TOWEL OR 17X24 6PK STRL BLUE (TOWEL DISPOSABLE) ×12 IMPLANT
TOWEL OR 17X26 10 PK STRL BLUE (TOWEL DISPOSABLE) ×12 IMPLANT
TUBE CONNECTING 12'X1/4 (SUCTIONS) ×1
TUBE CONNECTING 12X1/4 (SUCTIONS) ×3 IMPLANT
UNDERPAD 30X30 (UNDERPADS AND DIAPERS) ×8 IMPLANT
WATER STERILE IRR 1000ML POUR (IV SOLUTION) ×12 IMPLANT
YANKAUER SUCT BULB TIP NO VENT (SUCTIONS) ×4 IMPLANT
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
## 2017-01-11 NOTE — Anesthesia Procedure Notes (Signed)
Procedure Name: Intubation Date/Time: 01/11/2017 8:34 AM Performed by: Marena ChancyBECKNER, Klayton Monie S Pre-anesthesia Checklist: Patient identified, Emergency Drugs available, Suction available and Patient being monitored Patient Re-evaluated:Patient Re-evaluated prior to induction Oxygen Delivery Method: Circle System Utilized Preoxygenation: Pre-oxygenation with 100% oxygen Induction Type: IV induction Ventilation: Mask ventilation without difficulty Laryngoscope Size: Miller and 2 Grade View: Grade II Tube type: Oral Tube size: 7.5 mm Number of attempts: 1 Airway Equipment and Method: Stylet and Oral airway Placement Confirmation: ETT inserted through vocal cords under direct vision,  positive ETCO2 and breath sounds checked- equal and bilateral Tube secured with: Tape Dental Injury: Teeth and Oropharynx as per pre-operative assessment

## 2017-01-11 NOTE — Discharge Instructions (Addendum)
Orthopaedic Trauma Service Discharge Instructions   General Discharge Instructions  WEIGHT BEARING STATUS: weightbearing as tolerated R leg   RANGE OF MOTION/ACTIVITY: as tolerated   Wound Care: daily dressing changes to R foot starting on 01/13/2017. See below   Discharge Wound Care Instructions  Do NOT apply any ointments, solutions or lotions to pin sites or surgical wounds.  These prevent needed drainage and even though solutions like hydrogen peroxide kill bacteria, they also damage cells lining the pin sites that help fight infection.  Applying lotions or ointments can keep the wounds moist and can cause them to breakdown and open up as well. This can increase the risk for infection. When in doubt call the office.  Surgical incisions should be dressed daily.  If any drainage is noted, use one layer of adaptic, then gauze, Kerlix, and an ace wrap.  Once the incision is completely dry and without drainage, it may be left open to air out.  Showering may begin 36-48 hours later.  Cleaning gently with soap and water.  Traumatic wounds should be dressed daily as well.    One layer of adaptic, gauze, Kerlix, then ace wrap.  The adaptic can be discontinued once the draining has ceased    If you have a wet to dry dressing: wet the gauze with saline the squeeze as much saline out so the gauze is moist (not soaking wet), place moistened gauze over wound, then place a dry gauze over the moist one, followed by Kerlix wrap, then ace wrap.   Diet: as you were eating previously.  Can use over the counter stool softeners and bowel preparations, such as Miralax, to help with bowel movements.  Narcotics can be constipating.  Be sure to drink plenty of fluids  PAIN MEDICATION USE AND EXPECTATIONS  You have likely been given narcotic medications to help control your pain.  After a traumatic event that results in an fracture (broken bone) with or without surgery, it is ok to use narcotic pain  medications to help control one's pain.  We understand that everyone responds to pain differently and each individual patient will be evaluated on a regular basis for the continued need for narcotic medications. Ideally, narcotic medication use should last no more than 6-8 weeks (coinciding with fracture healing).   As a patient it is your responsibility as well to monitor narcotic medication use and report the amount and frequency you use these medications when you come to your office visit.   We would also advise that if you are using narcotic medications, you should take a dose prior to therapy to maximize you participation.  IF YOU ARE ON NARCOTIC MEDICATIONS IT IS NOT PERMISSIBLE TO OPERATE A MOTOR VEHICLE (MOTORCYCLE/CAR/TRUCK/MOPED) OR HEAVY MACHINERY DO NOT MIX NARCOTICS WITH OTHER CNS (CENTRAL NERVOUS SYSTEM) DEPRESSANTS SUCH AS ALCOHOL   STOP SMOKING OR USING NICOTINE PRODUCTS!!!!  As discussed nicotine severely impairs your body's ability to heal surgical and traumatic wounds but also impairs bone healing.  Wounds and bone heal by forming microscopic blood vessels (angiogenesis) and nicotine is a vasoconstrictor (essentially, shrinks blood vessels).  Therefore, if vasoconstriction occurs to these microscopic blood vessels they essentially disappear and are unable to deliver necessary nutrients to the healing tissue.  This is one modifiable factor that you can do to dramatically increase your chances of healing your injury.    (This means no smoking, no nicotine gum, patches, etc)  DO NOT USE NONSTEROIDAL ANTI-INFLAMMATORY DRUGS (NSAID'S)  Using products such  as Advil (ibuprofen), Aleve (naproxen), Motrin (ibuprofen) for additional pain control during fracture healing can delay and/or prevent the healing response.  If you would like to take over the counter (OTC) medication, Tylenol (acetaminophen) is ok.  However, some narcotic medications that are given for pain control contain acetaminophen  as well. Therefore, you should not exceed more than 4000 mg of tylenol in a day if you do not have liver disease.  Also note that there are may OTC medicines, such as cold medicines and allergy medicines that my contain tylenol as well.  If you have any questions about medications and/or interactions please ask your doctor/PA or your pharmacist.      ICE AND ELEVATE INJURED/OPERATIVE EXTREMITY  Using ice and elevating the injured extremity above your heart can help with swelling and pain control.  Icing in a pulsatile fashion, such as 20 minutes on and 20 minutes off, can be followed.    Do not place ice directly on skin. Make sure there is a barrier between to skin and the ice pack.    Using frozen items such as frozen peas works well as the conform nicely to the are that needs to be iced.  USE AN ACE WRAP OR TED HOSE FOR SWELLING CONTROL  In addition to icing and elevation, Ace wraps or TED hose are used to help limit and resolve swelling.  It is recommended to use Ace wraps or TED hose until you are informed to stop.    When using Ace Wraps start the wrapping distally (farthest away from the body) and wrap proximally (closer to the body)   Example: If you had surgery on your leg or thing and you do not have a splint on, start the ace wrap at the toes and work your way up to the thigh        If you had surgery on your upper extremity and do not have a splint on, start the ace wrap at your fingers and work your way up to the upper arm  IF YOU ARE IN A SPLINT OR CAST DO NOT REMOVE IT FOR ANY REASON   If your splint gets wet for any reason please contact the office immediately. You may shower in your splint or cast as long as you keep it dry.  This can be done by wrapping in a cast cover or garbage back (or similar)  Do Not stick any thing down your splint or cast such as pencils, money, or hangers to try and scratch yourself with.  If you feel itchy take benadryl as prescribed on the bottle for  itching  IF YOU ARE IN A CAM BOOT (BLACK BOOT)  You may remove boot periodically. Perform daily dressing changes as noted below.  Wash the liner of the boot regularly and wear a sock when wearing the boot. It is recommended that you sleep in the boot until told otherwise  CALL THE OFFICE WITH ANY QUESTIONS OR CONCERNS: [phone]     [facility] Instructions Hand Surgery  Wound Care: Keep your hand elevated above the level of your heart.  Do not allow it to dangle by your side.  Keep the dressing dry and do not remove it unless your doctor advises you to do so.  He will usually change it at the time of your post-op visit.  Moving your fingers is advised to stimulate circulation but will depend on the site of your surgery.  If you have a splint applied, your  doctor will advise you regarding movement.  Activity: Do not drive or operate machinery today.  Rest today and then you may return to your normal activity and work as indicated by your physician.  Diet:  Drink liquids today or eat a light diet.  You may resume a regular diet tomorrow.    General expectations: Pain for two to three days. Fingers may become slightly swollen.  Call your doctor if any of the following occur: Severe pain not relieved by pain medication. Elevated temperature. Dressing soaked with blood. Inability to move fingers. White or bluish color to fingers.
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
## 2017-01-11 NOTE — Transfer of Care (Signed)
Immediate Anesthesia Transfer of Care Note  Patient: Daniel Medina  Procedure(s) Performed: HARDWARE REMOVAL FROM RIGHT FOOT (Right Foot) HARDWARE REMOVAL BILATERAL WRIST (Bilateral )  Patient Location: PACU  Anesthesia Type:General  Level of Consciousness: awake, alert  and oriented  Airway & Oxygen Therapy: Patient Spontanous Breathing and Patient connected to nasal cannula oxygen  Post-op Assessment: Report given to RN and Post -op Vital signs reviewed and stable  Post vital signs: Reviewed and stable  Last Vitals:  Vitals:   01/11/17 1036  Temp: 36.6 C    Last Pain:  Vitals:   01/11/17 1036  PainSc: Asleep      Patients Stated Pain Goal: 2 (01/11/17 0701)  Complications: No apparent anesthesia complications
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
## 2017-01-11 NOTE — Op Note (Signed)
NAMCindra Medina:  Medina, Daniel Medina            ACCOUNT NO.:  [id number]  MEDICAL RECORD NO.:  [id number]  LOCATION:                                 FACILITY:  PHYSICIAN:  Betha LoaKevin Rosemary Mossbarger, MD        DATE OF BIRTH:  1991-01-07  DATE OF PROCEDURE:  01/11/2017 DATE OF DISCHARGE:                              OPERATIVE REPORT   PREOPERATIVE DIAGNOSIS:  Bilateral distal radius retained hardware.  POSTOPERATIVE DIAGNOSIS:  Bilateral distal radius retained hardware.  PROCEDURE:   1. Removal bridge plate, left wrist 2. Removal bridge plate, right wrist.  SURGEON:  Betha LoaKevin Shamiah Kahler, MD.  ASSISTANT:  None.  ANESTHESIA:  General.  IV FLUIDS:  Per anesthesia flow sheet.  ESTIMATED BLOOD LOSS:  Minimal.  COMPLICATIONS:  None.  SPECIMENS:  None.  TOURNIQUET TIME:  32 minutes on left, 34 minutes on right.  DISPOSITION:  Stable to PACU.  PREOPERATIVE INDICATIONS:  [name] is a 26 year old male, who underwent bilateral distal radius bridge plating for distal radius fractures several months ago.  He has healed the fractures and presents for removal of hardware.  Risks, benefits, and alternatives of surgery were discussed including the risk of blood loss; infection; damage to nerves, vessels, tendons, ligaments, bone; failure of surgery; need for additional surgery; complications with wound healing; continued pain; nonunion; malunion; stiffness; and instability.  He voiced understanding of these risks and elected to proceed.  OPERATIVE COURSE:  After being identified preoperatively by myself, surgical consent was signed.  Operative sites were marked.  The patient was transferred to the operating room and placed on the operating room table in supine position with bilateral upper extremities on arm boards. General anesthesia was induced by anesthesiologist.  Removal of hardware from his right foot was performed by Dr. Carola FrostHandy concurrently.  Left upper extremity was prepped and draped in normal sterile  orthopedic fashion. Surgical pause was performed between surgeons, anesthesia, and operating room staff; and all were in agreement as to the patient, procedure, and site of procedure.  Tourniquet at the proximal aspect of the extremity was inflated to 250 mmHg after exsanguination of the limb with an Esmarch bandage.  Previous incisions were followed both proximally and distally.  The central incision was not needed.  The hardware was identified and easily removed with the screwdrivers.  The plate was able to be removed after freeing up any adhesions centrally from the proximal and distal wounds.  There was 1 screw that had broken during insertion of the plate.  Radiographs were taken showing that the screw was not prominent.  This was left.  The radiographs showed, otherwise complete removal of hardware.  This distal radius was healed.  There was acceptable contour to the joint.  DR[facility]J [facility] in supination.  There were some motion and pronation, though not gross instability.  The wounds were copiously irrigated with sterile saline. A 4-0 Vicryl suture was used to repair soft tissues over the area of the plate distally.  The rongeurs had been used to take down any prominent bone.  Inverted interrupted Vicryl sutures were placed in subcutaneous tissues and skin was closed with 4-0 nylon in a horizontal mattress fashion.  The wounds were injected with 10 mL of 0.25% plain Marcaine to aid in postoperative analgesia.  They were then dressed with sterile Xeroform, 4x4s, and wrapped with Kerlix and Ace bandage.  Later, after completion of the procedures, a volar splint was placed and wrapped with the Ace bandage.  Tourniquet was deflated at 32 minutes.  Fingertips were pink with brisk capillary refill after deflation of tourniquet. Attention was turned to the right extremity.  The right upper extremity had been prepped and draped in normal sterile orthopedic  fashion. Tourniquet at the proximal aspect of the extremity was inflated to 250 mmHg after exsanguination of the limb with an Esmarch bandage.  Previous incisions were again followed.  The proximal and distal incisions were used and not the central one.  The plate was easily identified.  The screwdriver was used to remove the hardware, which was able to be removed in its entirety.  The Freer elevator was used to free up the adhesions centrally from both the proximal and distal wounds.  C-arm was used in AP and lateral projections to ensure complete removal of hardware, which was the case.  The distal radius was healed.  There was some loss of the lunate facet reduction, but this fragment was healed as well.  DRUJ was stable to [facility] testing in both pronation and supination.  The wounds were copiously irrigated with sterile saline. The 4-0 Vicryl suture was used to repair soft tissue back over the metacarpal.  The rongeurs had, again been used to remove any prominent bone.  Inverted interrupted Vicryl sutures placed in subcutaneous tissues and skin was closed with 4-0 nylon in a horizontal mattress fashion.  The wounds were injected with 10 mL of 0.25% plain Marcaine to aid in postoperative analgesia.  They were then dressed with sterile Xeroform, 4x4s, and wrapped with a Kerlix bandage.  A volar splint was placed and wrapped with Kerlix and Ace bandage.  Tourniquet was deflated at 34 minutes.  Fingertips were pink with brisk capillary refill after deflation of tourniquet.  The operative drapes were broken down and the patient was awoken from anesthesia safely.  He was transferred back to stretcher and taken to PACU in stable condition.  I will see him back in the office in 1 week for postoperative followup.  He was given prescription for pain medication by Dr. Carola Frost.     Betha Loa, MD     KK/MEDQ  D:  01/11/2017  T:  01/11/2017  Job:  [id number]

## 2017-01-11 NOTE — Anesthesia Postprocedure Evaluation (Signed)
Anesthesia Post Note  Patient: Daniel Medina  Procedure(s) Performed: HARDWARE REMOVAL FROM RIGHT FOOT (Right Foot) HARDWARE REMOVAL BILATERAL WRIST (Bilateral )     Patient location during evaluation: PACU Anesthesia Type: General Level of consciousness: awake and alert Pain management: pain level controlled Vital Signs Assessment: post-procedure vital signs reviewed and stable Respiratory status: spontaneous breathing, nonlabored ventilation and respiratory function stable Cardiovascular status: blood pressure returned to baseline and stable Postop Assessment: no apparent nausea or vomiting Anesthetic complications: no    Last Vitals:  Vitals:   01/11/17 1221 01/11/17 1230  BP: 135/71   Pulse: 90 79  Resp: 17 15  Temp:    SpO2: 100% 100%    Last Pain:  Vitals:   01/11/17 1230  PainSc: 0-No pain      LLE Sensation: Full sensation (01/11/17 1220) RLE Motor Response: Responds to commands (01/11/17 1220)        Americus Perkey,W. EDMOND

## 2017-01-11 NOTE — Anesthesia Preprocedure Evaluation (Signed)
Anesthesia Evaluation  Patient identified by MRN, date of birth, ID band Patient awake    Reviewed: Allergy & Precautions, NPO status , Patient's Chart, lab work & pertinent test results  Airway Mallampati: II  TM Distance: >3 FB Neck ROM: Full    Dental  (+) Teeth Intact, Dental Advisory Given   Pulmonary former smoker,    breath sounds clear to auscultation       Cardiovascular  Rhythm:Regular Rate:Normal     Neuro/Psych    GI/Hepatic   Endo/Other    Renal/GU      Musculoskeletal   Abdominal   Peds  Hematology   Anesthesia Other Findings   Reproductive/Obstetrics                             Anesthesia Physical Anesthesia Plan  ASA: II  Anesthesia Plan: General   Post-op Pain Management:    Induction: Intravenous  PONV Risk Score and Plan: Ondansetron  Airway Management Planned: Oral ETT  Additional Equipment:   Intra-op Plan:   Post-operative Plan: Extubation in OR  Informed Consent: I have reviewed the patients History and Physical, chart, labs and discussed the procedure including the risks, benefits and alternatives for the proposed anesthesia with the patient or authorized representative who has indicated his/her understanding and acceptance.   Dental advisory given  Plan Discussed with: CRNA and Anesthesiologist  Anesthesia Plan Comments:         Anesthesia Quick Evaluation

## 2017-01-11 NOTE — H&P (Signed)
Patient presents for removal hardware multiple extremities including bilateral wrists.    Well healed volar and dorsal incisions left wrist, dorsal incisions right wrist.  No swelling or signs of infection.  XR: bilateral bridge plates.  Healed distal radius fractures.  A/P: retained hardware.  Removal hardware in OR concurrently with Dr. Carola FrostHandy.  Surgical risks discussed with patient in office at preop visit.

## 2017-01-11 NOTE — Brief Op Note (Signed)
01/11/2017  9:55 AM  PATIENT:  Casandra DoffingBrandon R Kalmbach  26 y.o. male  PRE-OPERATIVE DIAGNOSIS:   1. SYMPTOMATIC HARDWARE OF RIGHT FOOT 2. BILATERAL DISTAL RADIUS RETAINED HARDWARE HARDWARE.  POST-OPERATIVE DIAGNOSIS:   1. SYMPTOMATIC HARDWARE OF RIGHT FOOT 2. BILATERAL DISTAL RADIUS RETAINED HARDWARE HARDWARE.  PROCEDURE:  Procedure(s): 1. HARDWARE REMOVAL FROM RIGHT FOOT (Right) 2. HARDWARE REMOVAL BILATERAL WRIST (Bilateral) 3. STRESS FLOURO OF RIGHT TARSAL METATARSAL JOINTS  SURGEON:  Surgeon(s) and Role: Panel 1:    Myrene Galas* Garrin Kirwan, MD - Primary  Panel 2:    * Betha LoaKuzma, Kevin, MD - Primary  PHYSICIAN ASSISTANT: PA student  ANESTHESIA:   general  EBL:  Minimal  BLOOD ADMINISTERED:none  DRAINS: none   LOCAL MEDICATIONS USED:  MARCAINE     SPECIMEN:  No Specimen  DISPOSITION OF SPECIMEN:  N/A  COUNTS:  YES  TOURNIQUET:  * Missing tourniquet times found for documented tourniquets in log:  [id number] * Total Tourniquet Time Documented: Upper Arm (Right) - 32 minutes Total: Upper Arm (Right) - 32 minutes   DICTATION: .Other Dictation: Dictation Number [id number]  PLAN OF CARE: Discharge to home after PACU  PATIENT DISPOSITION:  PACU - hemodynamically stable.   Delay start of Pharmacological VTE agent (>24hrs) due to surgical blood loss or risk of bleeding: no

## 2017-01-12 ENCOUNTER — Encounter (HOSPITAL_COMMUNITY): Payer: Self-pay | Admitting: Orthopedic Surgery

## 2018-01-12 IMAGING — DX DG FOOT COMPLETE 3+V*R*
3 series · 3 of 3 positions shown · non-contrast
Comparison: Right foot films of 07/21/2016

CLINICAL DATA: Removal of orthopedic hardware

EXAM:
RIGHT FOOT COMPLETE - 3+ VIEW

[foot ap]
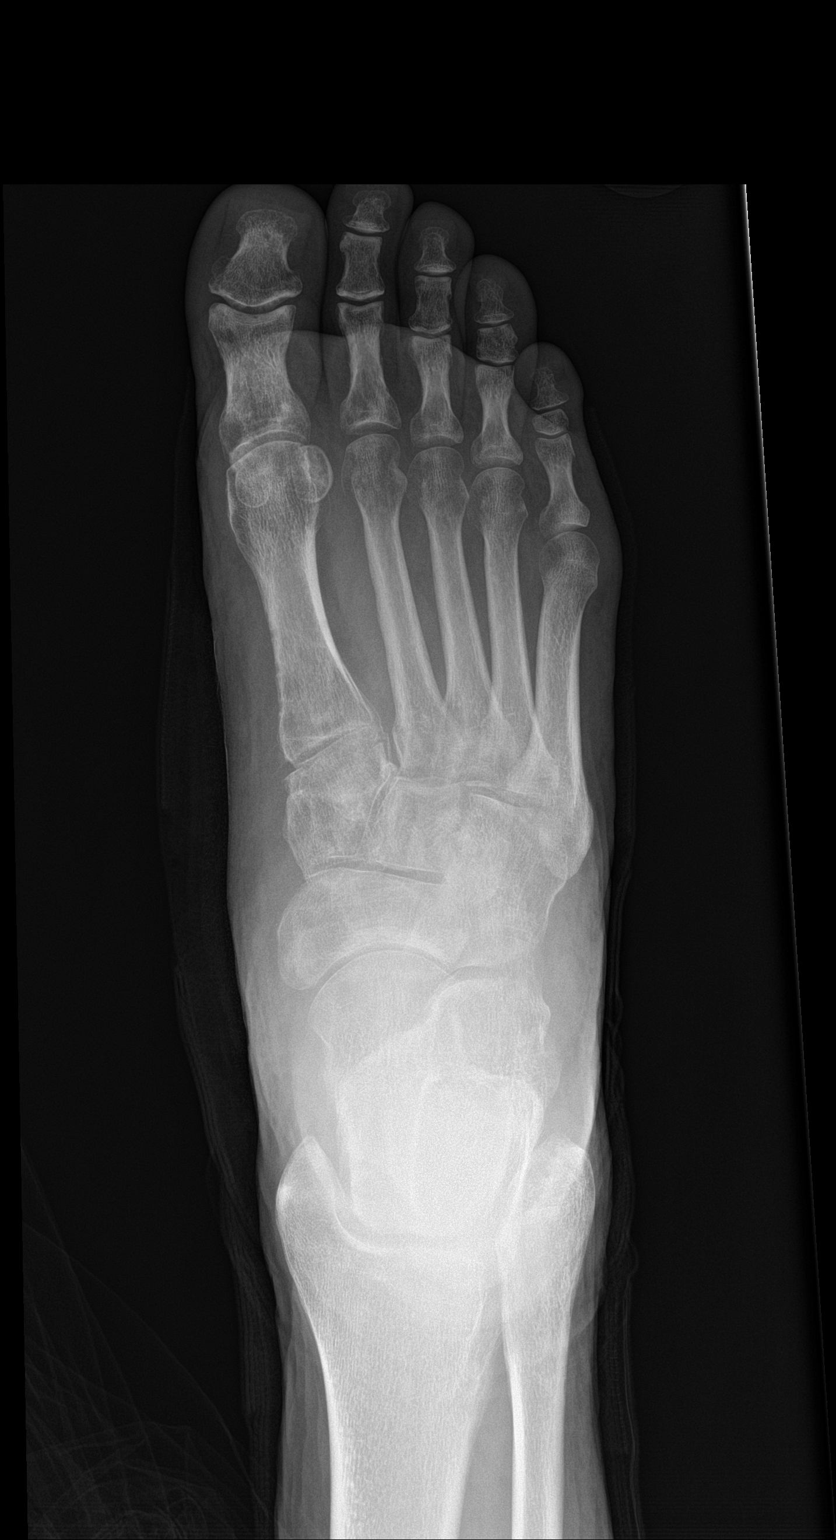

[foot obl]
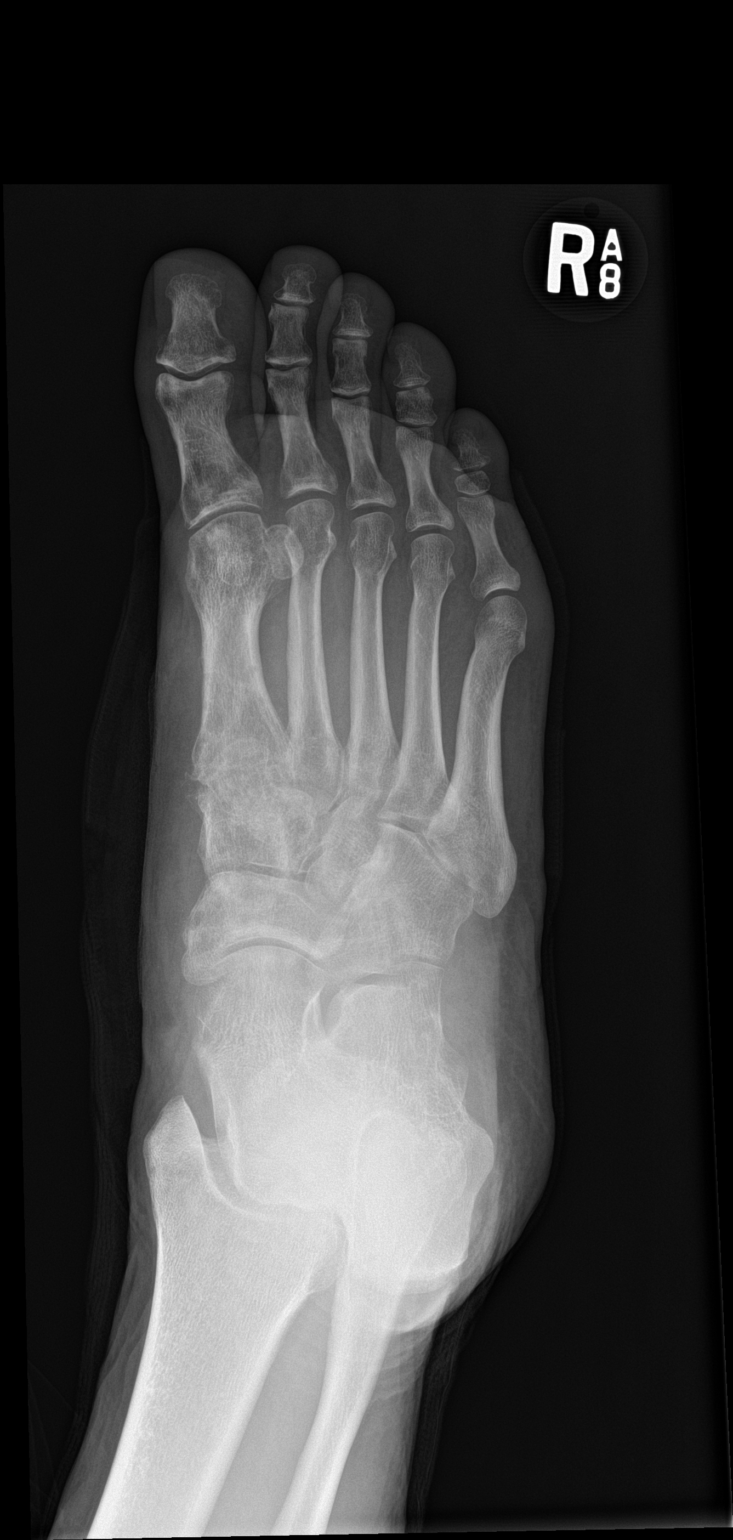

[foot lat]
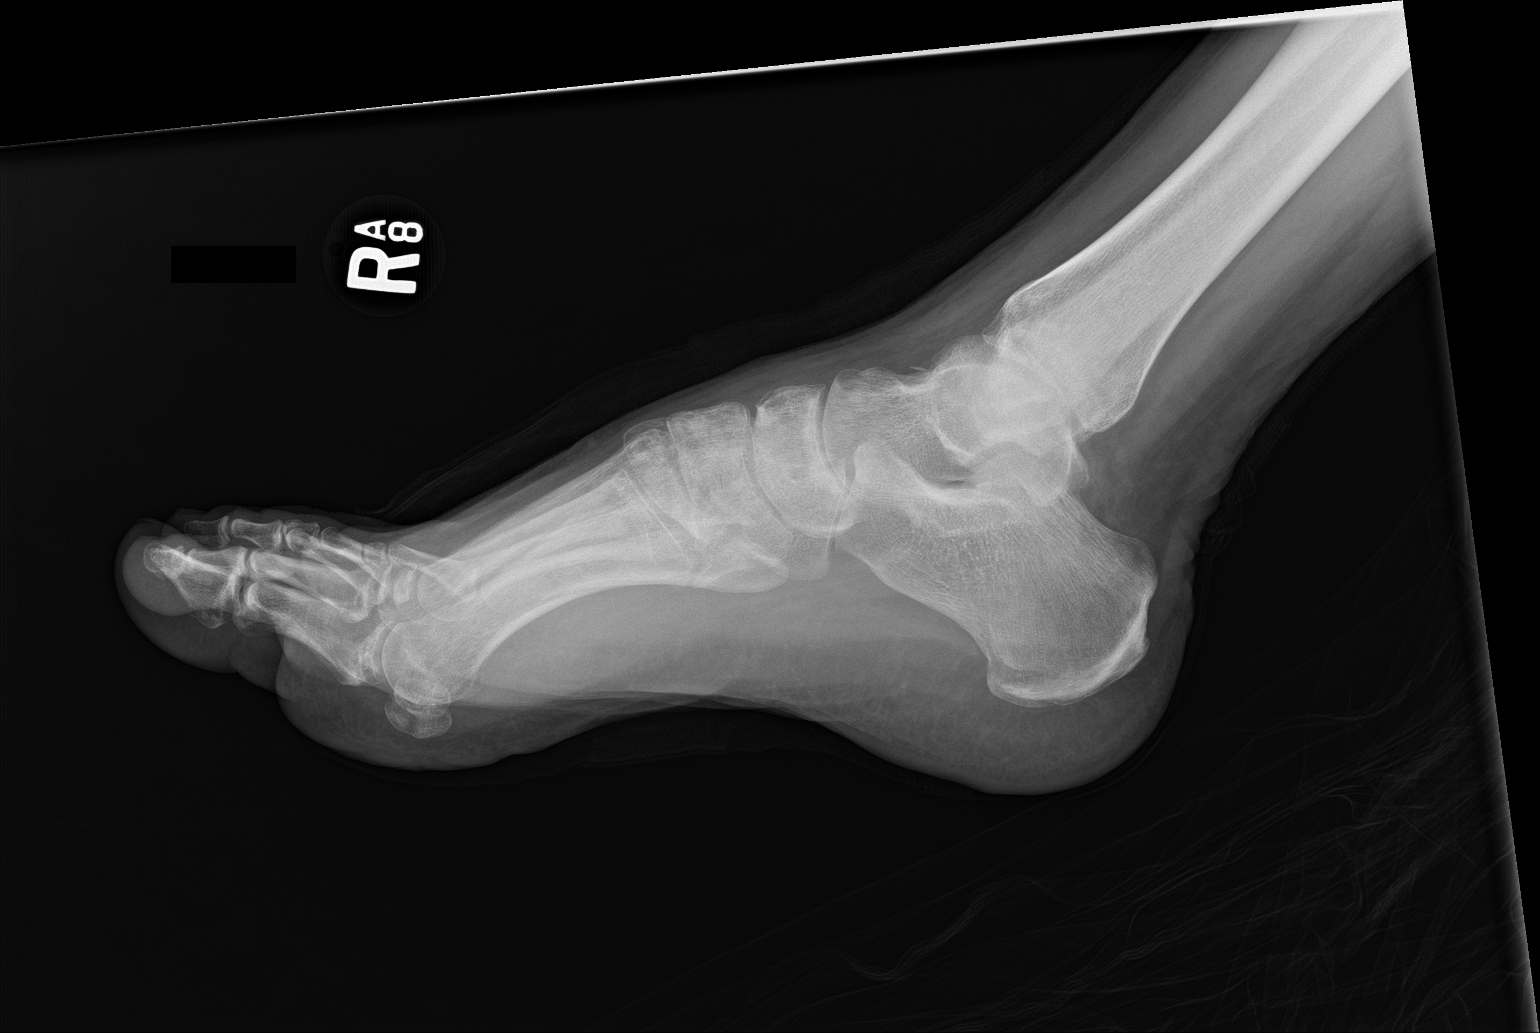

[3 of 3 positions shown; findings below may reference images not displayed]

FINDINGS: The hardware which previously extended from the tarsal navicula to
the mid right first metatarsal has been removed as well as K-wires.
There does appear to be partial fusion of the base of the right
first metatarsal with first cuneiform. The bones are somewhat
osteopenic most likely due to disuse. No acute abnormality is seen.
IMPRESSION: Removal of hardware.  No complicating features.
